# Patient Record
Sex: Male | Born: 1950 | Race: White | Hispanic: No | Marital: Married | Smoking: Never smoker
Health system: Southern US, Community
[De-identification: ages and names within clinical notes are randomized; demographics above are authoritative.]

## PROBLEM LIST (undated history)

## (undated) DIAGNOSIS — I2119 ST elevation (STEMI) myocardial infarction involving other coronary artery of inferior wall: Secondary | ICD-10-CM

## (undated) DIAGNOSIS — I10 Essential (primary) hypertension: Secondary | ICD-10-CM

## (undated) HISTORY — DX: ST elevation (STEMI) myocardial infarction involving other coronary artery of inferior wall: I21.19

## (undated) HISTORY — PX: KNEE SURGERY: SHX244

## (undated) HISTORY — PX: HERNIA REPAIR: SHX51

## (undated) HISTORY — PX: NECK SURGERY: SHX720

---

## 2012-02-06 ENCOUNTER — Other Ambulatory Visit: Payer: Self-pay

## 2012-02-06 ENCOUNTER — Encounter (HOSPITAL_COMMUNITY): Payer: Self-pay

## 2012-02-06 ENCOUNTER — Inpatient Hospital Stay (HOSPITAL_BASED_OUTPATIENT_CLINIC_OR_DEPARTMENT_OTHER)
Admission: EM | Admit: 2012-02-06 | Discharge: 2012-02-08 | DRG: 853 | Disposition: A | Discharge status: Home / Self Care | Payer: BC Managed Care – PPO | Attending: Cardiovascular Disease | Admitting: Cardiovascular Disease

## 2012-02-06 ENCOUNTER — Emergency Department (HOSPITAL_BASED_OUTPATIENT_CLINIC_OR_DEPARTMENT_OTHER): Payer: BC Managed Care – PPO

## 2012-02-06 ENCOUNTER — Encounter (HOSPITAL_BASED_OUTPATIENT_CLINIC_OR_DEPARTMENT_OTHER): Payer: Self-pay | Admitting: *Deleted

## 2012-02-06 DIAGNOSIS — I213 ST elevation (STEMI) myocardial infarction of unspecified site: Secondary | ICD-10-CM | POA: Diagnosis present

## 2012-02-06 DIAGNOSIS — I2119 ST elevation (STEMI) myocardial infarction involving other coronary artery of inferior wall: Principal | ICD-10-CM | POA: Diagnosis present

## 2012-02-06 DIAGNOSIS — E669 Obesity, unspecified: Secondary | ICD-10-CM

## 2012-02-06 DIAGNOSIS — E785 Hyperlipidemia, unspecified: Secondary | ICD-10-CM | POA: Diagnosis present

## 2012-02-06 DIAGNOSIS — Z6841 Body Mass Index (BMI) 40.0 and over, adult: Secondary | ICD-10-CM

## 2012-02-06 DIAGNOSIS — E119 Type 2 diabetes mellitus without complications: Secondary | ICD-10-CM | POA: Diagnosis present

## 2012-02-06 DIAGNOSIS — I10 Essential (primary) hypertension: Secondary | ICD-10-CM | POA: Diagnosis present

## 2012-02-06 DIAGNOSIS — I2510 Atherosclerotic heart disease of native coronary artery without angina pectoris: Secondary | ICD-10-CM

## 2012-02-06 DIAGNOSIS — I2582 Chronic total occlusion of coronary artery: Secondary | ICD-10-CM | POA: Diagnosis present

## 2012-02-06 DIAGNOSIS — E1169 Type 2 diabetes mellitus with other specified complication: Secondary | ICD-10-CM | POA: Diagnosis present

## 2012-02-06 DIAGNOSIS — Z79899 Other long term (current) drug therapy: Secondary | ICD-10-CM

## 2012-02-06 HISTORY — PX: CARDIAC CATHETERIZATION: SHX172

## 2012-02-06 HISTORY — DX: Essential (primary) hypertension: I10

## 2012-02-06 LAB — COMPREHENSIVE METABOLIC PANEL
ALT: 16 U/L (ref 0–53)
Alkaline Phosphatase: 56 U/L (ref 39–117)
CO2: 29 mEq/L (ref 19–32)
Calcium: 11.1 mg/dL — ABNORMAL HIGH (ref 8.4–10.5)
GFR calc Af Amer: 82 mL/min — ABNORMAL LOW (ref >90)
GFR calc non Af Amer: 71 mL/min — ABNORMAL LOW (ref >90)
Glucose, Bld: 179 mg/dL — ABNORMAL HIGH (ref 70–99)
Sodium: 137 mEq/L (ref 135–145)

## 2012-02-06 LAB — CBC
HCT: 45.8 % (ref 39.0–52.0)
Hemoglobin: 15.4 g/dL (ref 13.0–17.0)
MCH: 30.6 pg (ref 26.0–34.0)
RBC: 5.03 MIL/uL (ref 4.22–5.81)

## 2012-02-06 SURGERY — LEFT HEART CATHETERIZATION WITH CORONARY ANGIOGRAM

## 2012-02-06 MED ORDER — NITROGLYCERIN 1 MG/10 ML FOR IR/CATH LAB
INTRA_ARTERIAL | Status: AC
  Filled 2012-02-06: qty 10

## 2012-02-06 MED ORDER — MIDAZOLAM HCL 2 MG/2ML IJ SOLN
INTRAMUSCULAR | Status: AC
  Filled 2012-02-06: qty 2

## 2012-02-06 MED ORDER — HEPARIN (PORCINE) IN NACL 100-0.45 UNIT/ML-% IJ SOLN
INTRAMUSCULAR | Status: AC
  Administered 2012-02-06: 23:00:00
  Filled 2012-02-06: qty 250

## 2012-02-06 MED ORDER — ASPIRIN 81 MG PO CHEW
CHEWABLE_TABLET | ORAL | Status: AC
  Administered 2012-02-06: 324 mg via ORAL
  Filled 2012-02-06: qty 4

## 2012-02-06 MED ORDER — HEPARIN (PORCINE) IN NACL 2-0.9 UNIT/ML-% IJ SOLN
INTRAMUSCULAR | Status: AC
  Filled 2012-02-06: qty 1000

## 2012-02-06 MED ORDER — ASPIRIN 81 MG PO CHEW
324.0000 mg | CHEWABLE_TABLET | Freq: Once | ORAL | Status: AC
  Administered 2012-02-06: 324 mg via ORAL

## 2012-02-06 MED ORDER — HEPARIN SODIUM (PORCINE) 5000 UNIT/ML IJ SOLN
60.0000 [IU]/kg | INTRAMUSCULAR | Status: DC
  Administered 2012-02-06: 23:00:00 via INTRAVENOUS

## 2012-02-06 MED ORDER — NITROGLYCERIN 1 MG/10 ML FOR IR/CATH LAB
INTRA_ARTERIAL | Status: AC
Start: 2012-02-06 — End: 2012-02-06
  Filled 2012-02-06: qty 10

## 2012-02-06 MED ORDER — LIDOCAINE HCL (PF) 1 % IJ SOLN
INTRAMUSCULAR | Status: AC
  Filled 2012-02-06: qty 30

## 2012-02-06 MED ORDER — SODIUM CHLORIDE 0.9 % IV SOLN
INTRAVENOUS | Status: DC
  Administered 2012-02-06: 23:00:00 via INTRAVENOUS

## 2012-02-06 MED ORDER — FENTANYL CITRATE 0.05 MG/ML IJ SOLN
INTRAMUSCULAR | Status: AC
  Filled 2012-02-06: qty 2

## 2012-02-06 NOTE — H&P (Signed)
Patient ID: Deangelo Berns MRN: [id number], DOB/AGE: 04-07-1950   Admit date: 02/06/2012   Primary Physician: No primary provider on file. Primary Cardiologist: Dr Tresa Endo (new)  HPI:  62 y/o male with no prior history of CAD, recently moved here from [facility]. He developed chest pain off and on since Friday 02/03/12. He presented to [facility] tonight and an EKG showed inf ST elevation with ST depression in lead 1 and AVL. STEMI protocol was activated and the pt was transported to [facility]. He is taken to the cath lab urgenlty by Dr Tresa Endo. On arrival to the cath his VS were stable and he was in no distress.   Problem List: Past Medical History  Diagnosis Date  . Hypertension     Past Surgical History  Procedure Date  . Neck surgery   . Knee surgery   . Hernia repair     Rt groin     Allergies: No Known Allergies   Home Medications Prescriptions prior to admission  Medication Sig Dispense Refill  . olmesartan (BENICAR) 20 MG tablet Take 20 mg by mouth daily.         No family history on file.   History   Social History  . Marital Status: Married    Spouse Name: N/A    Number of Children: N/A  . Years of Education: N/A   Occupational History  . Not on file.   Social History Main Topics  . Smoking status: Never Smoker   . Smokeless tobacco: Not on file  . Alcohol Use: Yes  . Drug Use: No  . Sexually Active:    Other Topics Concern  . Not on file   Social History Narrative  . No narrative on file     ROS- not obtained as pt was taken to the lab directly from ER  Physical Exam: Blood pressure 141/105, pulse 67, temperature 98 F (36.7 C), temperature source Oral, resp. rate 20, height 6\' 1"  (1.854 m), weight 145.151 kg (320 lb), SpO2 98.00%.  General appearance: alert, cooperative, no distress and morbidly obese Neck: no carotid bruit and no JVD Lungs: clear to auscultation bilaterally Heart: regular rate and rhythm, S1, S2 normal, no murmur,  click, rub or gallop Abdomen: obese, Rt inguinal hernia scar Extremities: extremities normal, atraumatic, no cyanosis or edema Pulses: 2+ and symmetric Skin: Skin color, texture, turgor normal. No rashes or lesions Neurologic: Grossly normal    Labs:   Results for orders placed during the hospital encounter of 02/06/12 (from the past 24 hour(s))  APTT     Status: Abnormal   Collection Time   02/06/12 11:05 PM      Component Value Range   aPTT 156 (*) 24 - 37 seconds  CBC     Status: Abnormal   Collection Time   02/06/12 11:05 PM      Component Value Range   WBC 14.0 (*) 4.0 - 10.5 K/uL   RBC 5.03  4.22 - 5.81 MIL/uL   Hemoglobin 15.4  13.0 - 17.0 g/dL   HCT 40.9  81.1 - 91.4 %   MCV 91.1  78.0 - 100.0 fL   MCH 30.6  26.0 - 34.0 pg   MCHC 33.6  30.0 - 36.0 g/dL   RDW 78.2  95.6 - 21.3 %   Platelets 248  150 - 400 K/uL  COMPREHENSIVE METABOLIC PANEL     Status: Abnormal   Collection Time   02/06/12 11:05 PM  Component Value Range   Sodium 137  135 - 145 mEq/L   Potassium 3.9  3.5 - 5.1 mEq/L   Chloride 97  96 - 112 mEq/L   CO2 29  19 - 32 mEq/L   Glucose, Bld 179 (*) 70 - 99 mg/dL   BUN 21  6 - 23 mg/dL   Creatinine, Ser 1.61  0.50 - 1.35 mg/dL   Calcium 09.6 (*) 8.4 - 10.5 mg/dL   Total Protein 7.5  6.0 - 8.3 g/dL   Albumin 3.9  3.5 - 5.2 g/dL   AST 16  0 - 37 U/L   ALT 16  0 - 53 U/L   Alkaline Phosphatase 56  39 - 117 U/L   Total Bilirubin 0.3  0.3 - 1.2 mg/dL   GFR calc non Af Amer 71 (*) >90 mL/min   GFR calc Af Amer 82 (*) >90 mL/min  PROTIME-INR     Status: Normal   Collection Time   02/06/12 11:05 PM      Component Value Range   Prothrombin Time 13.7  11.6 - 15.2 seconds   INR 1.06  0.00 - 1.49     Radiology/Studies: Dg Chest Port 1 View  02/06/2012  *RADIOLOGY REPORT*  Clinical Data: Chest pain.  Myocardial infarct.  PORTABLE CHEST - 1 VIEW  Comparison: None.  Findings: Shallow inspiration.  Borderline heart size with normal pulmonary vascularity.  No  focal consolidation or airspace disease. No blunting of costophrenic angles.  No pneumothorax.  Mediastinal contours appear intact.  Postoperative changes in the cervical spine.  Degenerative changes in the left shoulder.  IMPRESSION: Shallow inspiration.  Mild cardiac enlargement.  No active pulmonary disease.   Original Report Authenticated By: Burman Nieves, M.D.     EKG:NSR. Inf ST elevation, ST depression lead 1 and AVL  ASSESSMENT AND PLAN:  Principal Problem:  *STEMI (ST elevation myocardial infarction) Active Problems:  HTN (hypertension)  Obesity  Plan- urgent cath  SignedAbelino Derrick, PA-C 02/07/2012, 12:00 AM    Patient seen and examined. Agree with assessment and plan. 62 yo WM with a history of htn on meds for 3 years, [facility] in setting of inferior STEMI with 2 mm inferior ST elevation and ST depression in I and avL. Pt has had 3 days of intermittent chest pain, and cp since 10 am. Plan emergent cath. Suspect RCA occlusion.   Lennette Bihari, MD, [facility] 02/07/2012 9:40 AM

## 2012-02-06 NOTE — ED Provider Notes (Signed)
History     CSN: [id number]  Arrival date & time 02/06/12  2241   First MD Initiated Contact with Patient 02/06/12 2257      Chief Complaint  Patient presents with  . Chest Pain    (Consider location/radiation/quality/duration/timing/severity/associated sxs/prior treatment) HPI This is a [age]-year-old male with a history of hypertension. He has no cardiac history. He has been having chest pain off and on for the last 3 days. There's been no all exacerbation with activity or relief with rest, though he has had worsening of symptoms after eating. He has taken TUMS without relief. The pain is located in the subxiphoid region without radiation. He describes it as a sharp, deep pain, about 7/10. There is no associated shortness of breath, nausea or diaphoresis.  Past Medical History  Diagnosis Date  . Hypertension     Past Surgical History  Procedure Date  . Neck surgery   . Knee surgery     No family history on file.  History  Substance Use Topics  . Smoking status: Never Smoker   . Smokeless tobacco: Not on file  . Alcohol Use: Yes      Review of Systems  All other systems reviewed and are negative.    Allergies  Review of patient's allergies indicates no known allergies.  Home Medications   Current Outpatient Rx  Name  Route  Sig  Dispense  Refill  . OLMESARTAN MEDOXOMIL 20 MG PO TABS   Oral   Take 20 mg by mouth daily.           BP 141/105  Pulse 67  Temp 98 F (36.7 C) (Oral)  Resp 20  SpO2 98%  Physical Exam General: Well-developed, well-nourished male in no acute distress; appearance consistent with age of record HENT: normocephalic, atraumatic Eyes: pupils equal round and reactive to light; extraocular muscles intact Neck: supple Heart: regular rate and rhythm Lungs: clear to auscultation bilaterally Abdomen: soft; nondistended; nontender; bowel sounds present Extremities: No deformity; full range of motion; pulses normal Neurologic: Awake,  alert and oriented; motor function intact in all extremities and symmetric; no facial droop Skin: Warm and dry Psychiatric: Normal mood and affect    ED Course  Procedures (including critical care time)  CRITICAL CARE Performed by: Malaisha Silliman L   Total critical care time: 30 minutes  Critical care time was exclusive of separately billable procedures and treating other patients.  Critical care was necessary to treat or prevent imminent or life-threatening deterioration.  Critical care was time spent personally by me on the following activities: development of treatment plan with patient and/or surrogate as well as nursing, discussions with consultants, evaluation of patient's response to treatment, examination of patient, obtaining history from patient or surrogate, ordering and performing treatments and interventions, ordering and review of laboratory studies, ordering and review of radiographic studies, pulse oximetry and re-evaluation of patient's condition.   MDM   Nursing notes and vitals signs, including pulse oximetry, reviewed.  Summary of this visit's results, reviewed by myself:  Labs:  Results for orders placed during the hospital encounter of 02/06/12 (from the past 24 hour(s))  APTT     Status: Abnormal   Collection Time   02/06/12 11:05 PM      Component Value Range   aPTT 156 (*) 24 - 37 seconds  CBC     Status: Abnormal   Collection Time   02/06/12 11:05 PM      Component Value Range   WBC 14.0 (*)  4.0 - 10.5 K/uL   RBC 5.03  4.22 - 5.81 MIL/uL   Hemoglobin 15.4  13.0 - 17.0 g/dL   HCT 65.7  84.6 - 96.2 %   MCV 91.1  78.0 - 100.0 fL   MCH 30.6  26.0 - 34.0 pg   MCHC 33.6  30.0 - 36.0 g/dL   RDW 95.2  84.1 - 32.4 %   Platelets 248  150 - 400 K/uL  COMPREHENSIVE METABOLIC PANEL     Status: Abnormal   Collection Time   02/06/12 11:05 PM      Component Value Range   Sodium 137  135 - 145 mEq/L   Potassium 3.9  3.5 - 5.1 mEq/L   Chloride 97  96 - 112 mEq/L    CO2 29  19 - 32 mEq/L   Glucose, Bld 179 (*) 70 - 99 mg/dL   BUN 21  6 - 23 mg/dL   Creatinine, Ser 4.01  0.50 - 1.35 mg/dL   Calcium 02.7 (*) 8.4 - 10.5 mg/dL   Total Protein 7.5  6.0 - 8.3 g/dL   Albumin 3.9  3.5 - 5.2 g/dL   AST 16  0 - 37 U/L   ALT 16  0 - 53 U/L   Alkaline Phosphatase 56  39 - 117 U/L   Total Bilirubin 0.3  0.3 - 1.2 mg/dL   GFR calc non Af Amer 71 (*) >90 mL/min   GFR calc Af Amer 82 (*) >90 mL/min  PROTIME-INR     Status: Normal   Collection Time   02/06/12 11:05 PM      Component Value Range   Prothrombin Time 13.7  11.6 - 15.2 seconds   INR 1.06  0.00 - 1.49    Imaging Studies: Dg Chest Port 1 View  02/06/2012  *RADIOLOGY REPORT*  Clinical Data: Chest pain.  Myocardial infarct.  PORTABLE CHEST - 1 VIEW  Comparison: None.  Findings: Shallow inspiration.  Borderline heart size with normal pulmonary vascularity.  No focal consolidation or airspace disease. No blunting of costophrenic angles.  No pneumothorax.  Mediastinal contours appear intact.  Postoperative changes in the cervical spine.  Degenerative changes in the left shoulder.  IMPRESSION: Shallow inspiration.  Mild cardiac enlargement.  No active pulmonary disease.   Original Report Authenticated By: Burman Nieves, M.D.       EKG Interpretation:  Date & Time: 02/06/2012 10:56 PM  Rate: 71  Rhythm: normal sinus rhythm  QRS Axis: normal  Intervals: normal  ST/T Wave abnormalities: Acute inferior myocardial infarction  Conduction Disutrbances:none  Narrative Interpretation:   Old EKG Reviewed: none available  11:25 PM Patient given chewable aspirin 324 mg. He was given 4000 unit heparin bolus and a heparin drip at 1000 units per hour. A Code STEMI was called immediately on evaluation of his EKG. The cath lab team was called in and Dr. Nicholaus Bloom is awaiting the patient [facility]. He was transported by EMS due to delay in getting a [facility] vehicle.   Hanley Seamen, MD 02/07/12 [phone]

## 2012-02-06 NOTE — ED Notes (Signed)
Code STEMI called by Dr. Read Drivers at 2300.  Work was in process to comply with Code STEMI

## 2012-02-06 NOTE — ED Notes (Addendum)
Pt. Has monitor on with Asprin 324mg  given and Heparin bolus of 4000units and 1000units per hour.  Pt. Is in no resp. Distress and has oxygen at 2liters [place].

## 2012-02-06 NOTE — ED Notes (Signed)
Pt. Gave verbal consent and will go to [facility] via EMS .

## 2012-02-06 NOTE — ED Notes (Signed)
Corey Hardin wife [phone] is her number to call.

## 2012-02-06 NOTE — ED Notes (Signed)
Sternal pain on and off x 3 days. No relief with tums. Pale. Unable to sleep.

## 2012-02-07 ENCOUNTER — Ambulatory Visit (HOSPITAL_COMMUNITY): Admit: 2012-02-07 | Payer: Self-pay | Admitting: Cardiovascular Disease

## 2012-02-07 ENCOUNTER — Encounter (HOSPITAL_COMMUNITY)
Admission: EM | Disposition: A | Discharge status: Home / Self Care | Payer: Self-pay | Source: Home / Self Care | Attending: Cardiovascular Disease

## 2012-02-07 DIAGNOSIS — E785 Hyperlipidemia, unspecified: Secondary | ICD-10-CM | POA: Diagnosis present

## 2012-02-07 DIAGNOSIS — E669 Obesity, unspecified: Secondary | ICD-10-CM | POA: Diagnosis present

## 2012-02-07 DIAGNOSIS — I2510 Atherosclerotic heart disease of native coronary artery without angina pectoris: Secondary | ICD-10-CM | POA: Diagnosis present

## 2012-02-07 HISTORY — PX: LEFT HEART CATHETERIZATION WITH CORONARY ANGIOGRAM: SHX5451

## 2012-02-07 LAB — URINALYSIS, ROUTINE W REFLEX MICROSCOPIC
Bilirubin Urine: NEGATIVE
Glucose, UA: NEGATIVE mg/dL
Hgb urine dipstick: NEGATIVE
Ketones, ur: NEGATIVE mg/dL
Nitrite: NEGATIVE
Specific Gravity, Urine: <1.005 — ABNORMAL LOW (ref 1.005–1.030)
pH: 6 (ref 5.0–8.0)

## 2012-02-07 LAB — POCT ACTIVATED CLOTTING TIME: Activated Clotting Time: 889 seconds

## 2012-02-07 LAB — BASIC METABOLIC PANEL
BUN: 19 mg/dL (ref 6–23)
CO2: 26 mEq/L (ref 19–32)
Calcium: 10 mg/dL (ref 8.4–10.5)
Calcium: 10.1 mg/dL (ref 8.4–10.5)
Chloride: 100 mEq/L (ref 96–112)
Creatinine, Ser: 0.91 mg/dL (ref 0.50–1.35)
GFR calc Af Amer: >90 mL/min (ref >90)
GFR calc non Af Amer: >90 mL/min (ref >90)
Glucose, Bld: 159 mg/dL — ABNORMAL HIGH (ref 70–99)
Glucose, Bld: 128 mg/dL — ABNORMAL HIGH (ref 70–99)
Potassium: 4.5 mEq/L (ref 3.5–5.1)
Sodium: 136 mEq/L (ref 135–145)

## 2012-02-07 LAB — TSH: TSH: 1.662 u[IU]/mL (ref 0.350–4.500)

## 2012-02-07 LAB — LIPID PANEL
Cholesterol: 202 mg/dL — ABNORMAL HIGH (ref 0–200)
HDL: 45 mg/dL (ref >39)
LDL Cholesterol: 136 mg/dL — ABNORMAL HIGH (ref 0–99)
Total CHOL/HDL Ratio: 4.5 RATIO
Triglycerides: 106 mg/dL (ref <150)
VLDL: 21 mg/dL (ref 0–40)

## 2012-02-07 LAB — CBC
HCT: 43.8 % (ref 39.0–52.0)
Hemoglobin: 14.7 g/dL (ref 13.0–17.0)
MCH: 29.9 pg (ref 26.0–34.0)
MCH: 29.8 pg (ref 26.0–34.0)
MCHC: 32.7 g/dL (ref 30.0–36.0)
MCV: 91.1 fL (ref 78.0–100.0)
Platelets: 219 10*3/uL (ref 150–400)
RBC: 4.81 MIL/uL (ref 4.22–5.81)
RDW: 13.2 % (ref 11.5–15.5)
WBC: 12.9 10*3/uL — ABNORMAL HIGH (ref 4.0–10.5)

## 2012-02-07 LAB — TROPONIN I
Troponin I: 14.57 ng/mL (ref <0.30)
Troponin I: 7.76 ng/mL (ref <0.30)
Troponin I: 6.15 ng/mL (ref <0.30)

## 2012-02-07 SURGERY — LEFT HEART CATHETERIZATION WITH CORONARY ANGIOGRAM
Anesthesia: LOCAL

## 2012-02-07 MED ORDER — TRAMADOL HCL 50 MG PO TABS
50.0000 mg | ORAL_TABLET | Freq: Four times a day (QID) | ORAL | Status: DC | PRN
  Filled 2012-02-07: qty 1

## 2012-02-07 MED ORDER — ASPIRIN EC 81 MG PO TBEC
81.0000 mg | DELAYED_RELEASE_TABLET | Freq: Every day | ORAL | Status: DC
  Administered 2012-02-07 – 2012-02-08 (×2): 81 mg via ORAL
  Filled 2012-02-07 (×2): qty 1

## 2012-02-07 MED ORDER — SODIUM CHLORIDE 0.9 % IJ SOLN: 3.0000 mL | INTRAMUSCULAR | Status: DC | PRN

## 2012-02-07 MED ORDER — ONDANSETRON HCL 4 MG/2ML IJ SOLN: 4.0000 mg | Freq: Four times a day (QID) | INTRAMUSCULAR | Status: DC | PRN

## 2012-02-07 MED ORDER — ALPRAZOLAM 0.25 MG PO TABS: 0.2500 mg | ORAL_TABLET | Freq: Two times a day (BID) | ORAL | Status: DC | PRN

## 2012-02-07 MED ORDER — BIVALIRUDIN 250 MG IV SOLR
INTRAVENOUS | Status: AC
  Filled 2012-02-07: qty 250

## 2012-02-07 MED ORDER — MORPHINE SULFATE 2 MG/ML IJ SOLN: 2.0000 mg | INTRAMUSCULAR | Status: DC | PRN

## 2012-02-07 MED ORDER — SODIUM CHLORIDE 0.9 % IV SOLN: 0.2500 mg/kg/h | INTRAVENOUS | Status: DC

## 2012-02-07 MED ORDER — SODIUM CHLORIDE 0.9 % IJ SOLN
3.0000 mL | Freq: Two times a day (BID) | INTRAMUSCULAR | Status: DC
  Administered 2012-02-07 – 2012-02-08 (×4): 3 mL via INTRAVENOUS

## 2012-02-07 MED ORDER — PANTOPRAZOLE SODIUM 40 MG PO TBEC
40.0000 mg | DELAYED_RELEASE_TABLET | Freq: Every day | ORAL | Status: DC
  Administered 2012-02-08: 40 mg via ORAL
  Filled 2012-02-07: qty 1

## 2012-02-07 MED ORDER — ACETAMINOPHEN 325 MG PO TABS: 650.0000 mg | ORAL_TABLET | ORAL | Status: DC | PRN

## 2012-02-07 MED ORDER — SODIUM CHLORIDE 0.9 % IV SOLN: INTRAVENOUS | Status: DC

## 2012-02-07 MED ORDER — SODIUM CHLORIDE 0.9 % IV SOLN: 1.0000 mg/kg/h | INTRAVENOUS | Status: DC

## 2012-02-07 MED ORDER — TICAGRELOR 90 MG PO TABS
90.0000 mg | ORAL_TABLET | Freq: Two times a day (BID) | ORAL | Status: DC
  Administered 2012-02-07 – 2012-02-08 (×3): 90 mg via ORAL
  Filled 2012-02-07 (×4): qty 1

## 2012-02-07 MED ORDER — IRBESARTAN 75 MG PO TABS
75.0000 mg | ORAL_TABLET | Freq: Every day | ORAL | Status: DC
  Administered 2012-02-07 – 2012-02-08 (×2): 75 mg via ORAL
  Filled 2012-02-07 (×2): qty 1

## 2012-02-07 MED ORDER — ZOLPIDEM TARTRATE 5 MG PO TABS: 5.0000 mg | ORAL_TABLET | Freq: Every evening | ORAL | Status: DC | PRN

## 2012-02-07 MED ORDER — NITROGLYCERIN 0.4 MG SL SUBL: 0.4000 mg | SUBLINGUAL_TABLET | SUBLINGUAL | Status: DC | PRN

## 2012-02-07 MED ORDER — METOPROLOL TARTRATE 12.5 MG HALF TABLET
12.5000 mg | ORAL_TABLET | Freq: Two times a day (BID) | ORAL | Status: DC
  Administered 2012-02-07 – 2012-02-08 (×3): 12.5 mg via ORAL
  Filled 2012-02-07 (×6): qty 1

## 2012-02-07 MED ORDER — ASPIRIN EC 81 MG PO TBEC: 81.0000 mg | DELAYED_RELEASE_TABLET | Freq: Every day | ORAL | Status: DC

## 2012-02-07 MED ORDER — SIMVASTATIN 40 MG PO TABS
40.0000 mg | ORAL_TABLET | Freq: Every day | ORAL | Status: DC
  Administered 2012-02-07: 40 mg via ORAL
  Filled 2012-02-07 (×2): qty 1

## 2012-02-07 MED ORDER — SODIUM CHLORIDE 0.9 % IV SOLN: 250.0000 mL | INTRAVENOUS | Status: DC | PRN

## 2012-02-07 MED ORDER — TICAGRELOR 90 MG PO TABS
ORAL_TABLET | ORAL | Status: AC
  Filled 2012-02-07: qty 2

## 2012-02-07 NOTE — CV Procedure (Signed)
Emergency Cath/PCI to RCA  Corey Hardin, 62 y.o., male  Full note dictated; see diagram  DICTATION # [phone], [id number]  AO:120/70 LV:120/19  LM: nl LAD: mild luminal irregularities proximally, 40% ostial DX1 LCx: nl RCA: prox-mid 100% occlusion  Successful PCI to RCA with Asahi medium wire, 2.0 x 12 trek, 2.5 x 23 Xience Xpedition DES stent, 2.75 x 15 [place] Trek with 100% to 0% and restoration of TIMI 3 flow.  EF 55% with minimal inferior hypokinesis  DTB time: 20 minutes  Angiomax, 180 mg brilinta, IC NTG.  Lennette Bihari, MD, [facility] 02/07/2012 1:10 AM

## 2012-02-07 NOTE — Progress Notes (Signed)
UR Completed.  Corey Hardin Jane [phone] 02/07/2012  

## 2012-02-07 NOTE — Cardiovascular Report (Signed)
NAMECHRISTIAN, Corey Hardin NO.:  [id number]  MEDICAL RECORD NO.:  [id number]  LOCATION:  2908                         FACILITY:  [facility]  PHYSICIAN:  Nicki Guadalajara, M.D.     DATE OF BIRTH:  [birth year]  DATE OF PROCEDURE:  02/06/2012 DATE OF DISCHARGE:                           CARDIAC CATHETERIZATION   PROCEDURE:  Emergent cardiac catheterization/percutaneous coronary intervention to a totally occluded right coronary artery.  INDICATIONS:  Mr. Corey Hardin is a 62 year old gentleman who has recently moved to this area from [place].  The patient has a several year history of hypertension as well as a history of obesity.  Approximately 3 days ago, he began noticing epigastric substernal chest pain which he attributed to indigestion.  At approximately 10:00 am today, he had recurrent episodes of chest pain, essentially all day and [facility] approximately just before 11 p.m. this evening.  The ECG revealed 2 to 3 mm of inferior ST elevation in II, III and F with reciprocal ST-segment depression and T-wave inversion in I and L.  An inferior code STEMI was called.  The patient was transported to [facility].  Prior to transfer, he was given aspirin, 4000 units of heparin, and morphine.  Upon arrival to [facility], he was taken up to the catheterization laboratory for acute catheterization.  DESCRIPTION OF PROCEDURE:  Upon arrival to the catheterization laboratory, the patient's chest pain had improved but he still had residual 4 to 10 chest discomfort, improved from 8/10.  His right femoral artery was punctured anteriorly and a 6-French sheath was inserted without difficulty.  Acute catheterization was done utilizing 6- [place] Jud[place] for a left diagnostic catheter.  With the supposition of an occluded RCA, a right guiding catheter was then inserted, 6-French. The patient received Angiomax bolus plus infusion and also 180 mg of oral  Brilinta.  His RCA was found to be totally occluded with TIMI 0 flow to the proximal to mid segment.  Asahi medium wire was advanced into the RCA and was able to open up the total occlusion.  Initial dilatation was done with a 2.0 x 12 mm TREK balloon.  A 2.5 x 23 mm Xience DES Xpedition stent was then inserted and deployed x2 to 11 atmospheres.  Post stent dilatation was done with a 2.75 x 15 mm noncompliant TREK.  Scout angiography confirmed an excellent angiographic result with brisk TIMI-3 flow and no evidence for dissection.  The balloon wire and right guide were removed and a 6-French pigtail catheter was inserted and advanced into the left ventricle.  RAO ventriculography was performed.  An LV-AO pullback was performed.  The arterial sheath was sutured in place.  The plan will be for the patient to continue Angiomax for approximately 4 hours from his Brilinta dosing. He left the catheterization laboratory, was transported to the CCU with stable hemodynamics pain free.  HEMODYNAMIC DATA:  Central aortic pressure is 120/70.  Left ventricular pressure 120/19.  ANGIOGRAPHIC DATA:  Left main coronary artery was angiographically normal and bifurcated into an LAD and left circumflex system.  The proximal LAD had mild luminal irregularities before giving rise to  the 1st proximal diagonal vessel.  There was 40% ostial narrowing at the origin of this diagonal vessel.  The remainder of the LAD was free of significant disease.  The circumflex vessel gave rise to 2 marginal vessels and was free of significant disease.  The right coronary artery was totally occluded in the proximal to mid segment and there was TIMI 0 flow.  Following successful coronary intervention to the RCA with PTCA and ultimate stenting with a 2.5 x 23 mm DES Xience stent, post dilated to approximately 2.71 mm, the 100% occlusion was reduced to 0%.  There was brisk TIMI-3 flow, no evidence for dissection.  The  patient arrived to [facility] catheterization laboratory at 2339.  The 1st balloon inflation was at 2359 giving a total balloon time of 20 minutes.  IMPRESSION: 1. Acute ST-segment elevation inferior wall myocardial infarction     secondary to total occlusion of the proximal to mid right coronary     artery. 2. Mild luminal irregularities of the LAD with 40% ostial diagonal 1     stenosis. 3. Normal circumflex of coronary artery. 4. Successful percutaneous coronary intervention of the of a totally     occluded right coronary artery with 100% occlusion being reduced to     0% with ultimate insertion of a 2.5 x 23 mm Xience DES Xpedition     stent post dilated to 2.71 mm. 5. Bivalirudin/one 180 mg of Brilinta/IC nitroglycerin. 6. Total balloon time 20 minutes.          ______________________________ Nicki Guadalajara, M.D.     TK/MEDQ  D:  02/07/2012  T:  02/07/2012  Job:  [id number]

## 2012-02-07 NOTE — Progress Notes (Signed)
[facility]  Subjective: No further CP.  No SOB, orthopnea  Objective: Vital signs in last 24 hours: Temp:  [97.6 F (36.4 C)-98 F (36.7 C)] 97.6 F (36.4 C) (02/04 0100) Pulse Rate:  [62-89] 62  (02/04 0600) Resp:  [9-20] 12  (02/04 0600) BP: (111-141)/(64-105) 113/67 mmHg (02/04 0600) SpO2:  [94 %-98 %] 97 % (02/04 0600) Arterial Line BP: (118-141)/(68-78) 131/74 mmHg (02/04 0600) Weight:  [145.151 kg (320 lb)] 145.151 kg (320 lb) (02/03 2307)    Intake/Output from previous day: 02/03 0701 - 02/04 0700 In: 560.8 [I.V.:560.8] Out: 475 [Urine:475] Intake/Output this shift:    Medications Current Facility-Administered Medications  Medication Dose Route Frequency Provider Last Rate Last Dose  . 0.9 %  sodium chloride infusion   Intravenous Continuous Carlisle Beers Mol[place], MD 20 mL/hr at 02/06/12 2315    . 0.9 %  sodium chloride infusion  250 mL Intravenous PRN Abelino Derrick, PA      . 0.9 %  sodium chloride infusion   Intravenous Continuous Lennette Bihari, MD 125 mL/hr at 02/07/12 0140 125 mL/hr at 02/07/12 0140  . acetaminophen (TYLENOL) tablet 650 mg  650 mg Oral Q4H PRN Lennette Bihari, MD      . ALPRAZolam Prudy Feeler) tablet 0.25 mg  0.25 mg Oral BID PRN Abelino Derrick, PA      . aspirin EC tablet 81 mg  81 mg Oral Daily Lennette Bihari, MD      . bivalirudin (ANGIOMAX) 5 mg/mL in sodium chloride 0.9 % 50 mL infusion  0.25 mg/kg/hr Intravenous Continuous Lennette Bihari, MD   0.25 mg/kg/hr at 02/07/12 0210  . irbesartan (AVAPRO) tablet 75 mg  75 mg Oral Daily Eda Paschal [place], [place]      . metoprolol tartrate (LOPRESSOR) tablet 12.5 mg  12.5 mg Oral BID Eda Paschal [place], PA   12.5 mg at 02/07/12 0215  . morphine 2 MG/ML injection 2 mg  2 mg Intravenous Q1H PRN Lennette Bihari, MD      . nitroGLYCERIN (NITROSTAT) SL tablet 0.4 mg  0.4 mg Sublingual Q5 Min x 3 PRN Abelino Derrick, PA      . ondansetron [facility]) injection 4 mg  4 mg Intravenous Q6H PRN Lennette Bihari, MD       . pantoprazole (PROTONIX) EC tablet 40 mg  40 mg Oral Q0600 Eda Paschal [place], [place]      . simvastatin (ZOCOR) tablet 40 mg  40 mg Oral q1800 Eda Paschal [place], [place]      . sodium chloride 0.9 % injection 3 mL  3 mL Intravenous Q12H Eda Paschal [place], PA   3 mL at 02/07/12 0118  . sodium chloride 0.9 % injection 3 mL  3 mL Intravenous PRN Abelino Derrick, PA      . Ticagrelor (BRILINTA) tablet 90 mg  90 mg Oral BID Lennette Bihari, MD      . traMADol Janean Sark) tablet 50 mg  50 mg Oral Q6H PRN Abelino Derrick, PA      . zolpidem (AMBIEN) tablet 5 mg  5 mg Oral QHS PRN,MR X 1 Eda Paschal [place], [place]        PE: General appearance: alert, cooperative and no distress Lungs: clear to auscultation bilaterally Heart: regular rate and rhythm, S1, S2 normal, no murmur, click, rub or gallop Extremities: 1+ LEE Pulses: 2+ and symmetric Skin: Warm and dry Neurologic: Grossly normal  Lab Results:  Basename 02/07/12 0510 02/07/12 0125 02/06/12 2305  WBC 10.7* 12.9* 14.0*  HGB 14.7 14.4 15.4  HCT 45.0 43.8 45.8  PLT 220 219 248   BMET  Basename 02/07/12 0510 02/07/12 0125 02/06/12 2305  NA 136 136 137  K 4.5 4.3 3.9  CL 100 100 97  CO2 27 26 29   GLUCOSE 128* 159* 179*  BUN 17 19 21   CREATININE 0.87 0.91 1.10  CALCIUM 10.1 10.0 11.1*   PT/INR  Basename 02/06/12 2305  LABPROT 13.7  INR 1.06   Cholesterol  Basename 02/07/12 0125  CHOL 202*   Lipid Panel     Component Value Date/Time   CHOL 202* 02/07/2012 0125   TRIG 106 02/07/2012 0125   HDL 45 02/07/2012 0125   CHOLHDL 4.5 02/07/2012 0125   VLDL 21 02/07/2012 0125   LDLCALC 136* 02/07/2012 0125    Cardiac Panel (last 3 results)  Basename 02/07/12 0124  CKTOTAL --  CKMB --  TROPONINI 1.49*  RELINDX --   DICTATION # [id number], [id number]  AO:120/70  LV:120/19  LM: nl  LAD: mild luminal irregularities proximally, 40% ostial DX1  LCx: nl  RCA: prox-mid 100% occlusion  Successful PCI to RCA with Asahi medium wire, 2.0 x 12 trek, 2.5 x 23 Xience  Xpedition DES stent, 2.75 x 15 [place] Trek with 100% to 0% and restoration of TIMI 3 flow.  EF 55% with minimal inferior hypokinesis  DTB time: 20 minutes  Angiomax, 180 mg brilinta, IC NTG.  Assessment/Plan  Principal Problem:  *STEMI (ST elevation myocardial infarction) Active Problems:  HTN (hypertension)  Obesity  Plan:  S/P PCI with DES to RCA.   Doing very well.  Sheath being pulled right now.  BP and labs stable.  Troponin to 1.49 so far.  ASA, ARB, lopressor statin.   Will need dietary consult and an outpatient.       LOS: 1 day    HAGER, BRYAN 02/07/2012 7:46 AM  I have seen and examined the patient along with HAGER, BRYAN, PA.  I have reviewed the chart, notes and new data.  I agree with PA's note.  Key new complaints: feels well Key examination changes: no groin complications; no clinical signs of HF  PLAN: Transfer telemetry. Home in 24-48 hours. Discussed role of dual antiplatelet therapy, purpose of beta blockers and statin, dietary and lifestyle changes, cardiac rehab in detail  Thurmon Fair, MD, [facility] [phone] 02/07/2012, 3:01 PM

## 2012-02-07 NOTE — Progress Notes (Signed)
RFA sheath removed at 0735 without complications. Pressure to site x 25nin. Site level zero. Dressing applied. Pt teaching done.Right posterior tibial pulse bounding. Bedrest begins at 0800.

## 2012-02-07 NOTE — Progress Notes (Signed)
Pt just walked with RN, apparently no problems. Resting now. Discussed MI, stent, restrictions, diet (including Hgb A1C). Pt sts he does not have PCP here in [place]. Voiced understanding and thankful for information. Will f/u in am. [phone] Ethelda Chick CES, ACSM

## 2012-02-08 DIAGNOSIS — E669 Obesity, unspecified: Secondary | ICD-10-CM | POA: Diagnosis present

## 2012-02-08 DIAGNOSIS — E1169 Type 2 diabetes mellitus with other specified complication: Secondary | ICD-10-CM | POA: Diagnosis present

## 2012-02-08 LAB — BASIC METABOLIC PANEL
BUN: 17 mg/dL (ref 6–23)
CO2: 26 mEq/L (ref 19–32)
Calcium: 9.5 mg/dL (ref 8.4–10.5)
Chloride: 102 mEq/L (ref 96–112)
Creatinine, Ser: 0.96 mg/dL (ref 0.50–1.35)
GFR calc Af Amer: >90 mL/min (ref >90)

## 2012-02-08 MED ORDER — NITROGLYCERIN 0.4 MG SL SUBL: 0.4000 mg | SUBLINGUAL_TABLET | SUBLINGUAL | Status: AC | PRN

## 2012-02-08 MED ORDER — SIMVASTATIN 40 MG PO TABS: 40.0000 mg | ORAL_TABLET | Freq: Every day | ORAL | Status: DC

## 2012-02-08 MED ORDER — TICAGRELOR 90 MG PO TABS: 90.0000 mg | ORAL_TABLET | Freq: Two times a day (BID) | ORAL | Status: DC

## 2012-02-08 MED ORDER — ASPIRIN 81 MG PO TBEC: 81.0000 mg | DELAYED_RELEASE_TABLET | Freq: Every day | ORAL | Status: AC

## 2012-02-08 MED ORDER — METOPROLOL TARTRATE 12.5 MG HALF TABLET: 12.5000 mg | ORAL_TABLET | Freq: Two times a day (BID) | ORAL | Status: DC

## 2012-02-08 MED FILL — Dextrose Inj 5%: INTRAVENOUS | Qty: 50 | Status: AC

## 2012-02-08 NOTE — Progress Notes (Addendum)
Pt. Seen and examined. Agree with the NP/PA-C note as written.  Groin stable. Chest pain free. Labs WNL. On appropriate meds. Rehab is evaluating. Ok for discharge home today. Follow-up with Dr. Tresa Endo.  Rx for prn nitro tabs to go home with.  Chrystie Nose, MD, [facility] Attending Cardiologist [facility]

## 2012-02-08 NOTE — Progress Notes (Signed)
[phone] Pt stated he walked earlier without chest pain and tolerated well. Education completed with pt and wife. Discussed counting carbs and heart healthy diet. Encouraged pt to follow up with primary doctor since HgA1C slightlly elevated. Discussed CRP 2 and pt gave permission to refer to [facility] Phase 2. Luetta Nutting RN

## 2012-02-08 NOTE — Discharge Summary (Signed)
Physician Discharge Summary  Patient ID: Corey Hardin MRN: [id number] DOB/AGE: 1950-05-06 62 y.o.  Admit date: 02/06/2012 Discharge date: 02/08/2012  Admission Diagnoses: STEMI  Discharge Diagnoses:  Principal Problem:  *STEMI (ST elevation myocardial infarction) Active Problems:  HTN (hypertension)  Obesity  CAD, RCA DES 02/06/12  Dyslipidemia, LDL 136- statin added  Diabetes mellitus type 2 in obese   Discharged Condition: stable  Hospital Course: The patient is a 62 y/o caucasian male, with a history significant for HTN, HLD and obesity, who was admitted on 2/3 for STEMI.  He initially presented to [facility] with a complaint of chest pain. An EKG, at time of presentation, showed inferior ST elevation with ST depressions in leads 1 and AVL. STEMI protocol was activated and the patient was transported to [facility]. He was taken to the cath lab urgently by Dr. Tresa Endo.  The cath revealed a totally occluded RCA. He underwent successful PCI and stenting to the RCA, using a DES. Door to balloon time was 20 minutes.  He had normal LV systolic function, with an estimated EF of 55%. Minimal inferior hypokinesis was noted. He left the cath lab in stable condition. He was later placed on telemetry for further monitoring/observation. He had no further chest pain. Troponin levels decreased. The right femoral access sight was stable. He was last seen and examined by Dr. Rennis Golden who felt he was stable for d/c home. He will be discharged on dual antiplatelet therapy with Ticagrelor and ASA.  He is also on a BB, an ARB, statin and SL NTG, prn.  He will have OP cardiac rehab, tentatively scheduled for 3 times per week. He will also have OP dietary consult with a dietician.  Follow up has been arranged with Dr. Tresa Endo on 2/20, at our [facility] office.  Consults: None  Significant Diagnostic Studies: Emergent Cardiac Catheterization 02/07/12 AO:120/70  LV:120/19  LM: nl  LAD: mild luminal irregularities  proximally, 40% ostial DX1  LCx: nl  RCA: prox-mid 100% occlusion  Successful PCI to RCA with Asahi medium wire, 2.0 x 12 trek, 2.5 x 23 Xience Xpedition DES stent, 2.75 x 15 Odum Trek with 100% to 0% and restoration of TIMI 3 flow.  EF 55% with minimal inferior hypokinesis  DTB time: 20 minutes  Angiomax, 180 mg brilinta, IC NTG.   Lennette Bihari, MD, [facility]  02/07/2012  1:10 AM   Treatments: See Hospital Course  Discharge Exam: Blood pressure 128/86, pulse 81, temperature 97.7 F (36.5 C), temperature source Oral, resp. rate 18, height 6\' 1"  (1.854 m), weight 320 lb (145.151 kg), SpO2 96.00%.   Disposition: Final discharge disposition not confirmed      Discharge Orders    Future Orders Please Complete By Expires   Amb Referral to Cardiac Rehabilitation      Diet - low sodium heart healthy      Increase activity slowly      Driving Restrictions      Comments:   No driving for 3 days   Lifting restrictions      Comments:   No lifting more than 1/2 gallon of milk for 3 days       Medication List     As of 02/08/2012  2:45 PM    TAKE these medications         aspirin 81 MG EC tablet   Take 1 tablet (81 mg total) by mouth daily.      metoprolol tartrate 12.5 mg Tabs   Commonly  known as: LOPRESSOR   Take 0.5 tablets (12.5 mg total) by mouth 2 (two) times daily.      nitroGLYCERIN 0.4 MG SL tablet   Commonly known as: NITROSTAT   Place 1 tablet (0.4 mg total) under the tongue every 5 (five) minutes x 3 doses as needed for chest pain.      olmesartan 20 MG tablet   Commonly known as: BENICAR   Take 20 mg by mouth daily.      simvastatin 40 MG tablet   Commonly known as: ZOCOR   Take 1 tablet (40 mg total) by mouth daily at 6 PM.      Ticagrelor 90 MG Tabs tablet   Commonly known as: BRILINTA   Take 1 tablet (90 mg total) by mouth 2 (two) times daily.      Ticagrelor 90 MG Tabs tablet   Commonly known as: BRILINTA   Take 1 tablet (90 mg total) by mouth 2 (two)  times daily.        Follow-up Information    Follow up with Lennette Bihari, MD. On 02/23/2012. (3:00 pm)    Contact information:   [address] [place] [address] [phone]          Signed: Robbie Lis 02/08/2012, 2:45 PM

## 2012-02-08 NOTE — Progress Notes (Signed)
[facility]  Subjective: Feeling great. Denies CP/SOB. Has been ambulating around unit w/o difficulty.   Objective: Vital signs in last 24 hours: Temp:  [97.7 F (36.5 C)-98.2 F (36.8 C)] 97.9 F (36.6 C) (02/05 0457) Pulse Rate:  [59-69] 66  (02/05 0457) Resp:  [10-18] 18  (02/05 0457) BP: (93-120)/(60-82) 110/67 mmHg (02/05 0457) SpO2:  [94 %-98 %] 96 % (02/05 0457) Last BM Date: 02/06/12  Intake/Output from previous day: 02/04 0701 - 02/05 0700 In: 1476 [P.O.:720; I.V.:756] Out: 1000 [Urine:1000] Intake/Output this shift:    Medications Current Facility-Administered Medications  Medication Dose Route Frequency Provider Last Rate Last Dose  . 0.9 %  sodium chloride infusion   Intravenous Continuous Carlisle Beers Molpus, MD 20 mL/hr at 02/06/12 2315    . 0.9 %  sodium chloride infusion  250 mL Intravenous PRN Abelino Derrick, PA      . 0.9 %  sodium chloride infusion   Intravenous Continuous Lennette Bihari, MD   125 mL/hr at 02/07/12 0140  . acetaminophen (TYLENOL) tablet 650 mg  650 mg Oral Q4H PRN Lennette Bihari, MD      . ALPRAZolam Prudy Feeler) tablet 0.25 mg  0.25 mg Oral BID PRN Abelino Derrick, PA      . aspirin EC tablet 81 mg  81 mg Oral Daily Lennette Bihari, MD   81 mg at 02/08/12 1007  . irbesartan (AVAPRO) tablet 75 mg  75 mg Oral Daily Eda Paschal [place], [place]   75 mg at 02/08/12 1007  . metoprolol tartrate (LOPRESSOR) tablet 12.5 mg  12.5 mg Oral BID Abelino Derrick, PA   12.5 mg at 02/08/12 1007  . morphine 2 MG/ML injection 2 mg  2 mg Intravenous Q1H PRN Lennette Bihari, MD      . nitroGLYCERIN (NITROSTAT) SL tablet 0.4 mg  0.4 mg Sublingual Q5 Min x 3 PRN Abelino Derrick, PA      . ondansetron [facility]) injection 4 mg  4 mg Intravenous Q6H PRN Lennette Bihari, MD      . pantoprazole (PROTONIX) EC tablet 40 mg  40 mg Oral Q0600 Eda Paschal [place], [place]   40 mg at 02/08/12 1610  . simvastatin (ZOCOR) tablet 40 mg  40 mg Oral q1800 Eda Paschal [place], [place]   40 mg at  02/07/12 1618  . sodium chloride 0.9 % injection 3 mL  3 mL Intravenous Q12H Eda Paschal [place], PA   3 mL at 02/08/12 1008  . sodium chloride 0.9 % injection 3 mL  3 mL Intravenous PRN Abelino Derrick, PA      . Ticagrelor (BRILINTA) tablet 90 mg  90 mg Oral BID Lennette Bihari, MD   90 mg at 02/08/12 1007  . traMADol (ULTRAM) tablet 50 mg  50 mg Oral Q6H PRN Abelino Derrick, PA      . zolpidem (AMBIEN) tablet 5 mg  5 mg Oral QHS PRN,MR X 1 Eda Paschal [place], [place]        PE: General appearance: alert, cooperative and no distress Lungs: clear to auscultation bilaterally Heart: regular rate and rhythm Extremities: no LEE Pulses: 2+ and symmetric Skin: warm and dry Neurologic: Grossly normal  Lab Results:   Basename 02/07/12 0510 02/07/12 0125 02/06/12 2305  WBC 10.7* 12.9* 14.0*  HGB 14.7 14.4 15.4  HCT 45.0 43.8 45.8  PLT 220 219 248   BMET  Basename 02/08/12 0607 02/07/12 0510 02/07/12  0125  NA 137 136 136  K 4.1 4.5 4.3  CL 102 100 100  CO2 26 27 26   GLUCOSE 116* 128* 159*  BUN 17 17 19   CREATININE 0.96 0.87 0.91  CALCIUM 9.5 10.1 10.0   PT/INR  Basename 02/06/12 2305  LABPROT 13.7  INR 1.06   Cholesterol  Basename 02/07/12 0125  CHOL 202*   Cardiac Panel (last 3 results)  Basename 02/07/12 2056 02/07/12 1533 02/07/12 0905  CKTOTAL -- -- --  CKMB -- -- --  TROPONINI 6.15* 7.76* 14.57*  RELINDX -- -- --    Assessment/Plan  Principal Problem:  *STEMI (ST elevation myocardial infarction) Active Problems:  HTN (hypertension)  Obesity  CAD, RCA DES 02/06/12  Dyslipidemia, LDL 136- statin added   Plan: Pt was admitted for STEMI on 02/07/12. S/P PCI to RCA w/ DES. POD#1. He is CP free.  He is on dual antiplatelet therapy with Brilinta and ASA. He is also on a BB, ARB and statin. HR and BP both stable. NSR on telemetry w/ no red alarms. Troponins are downtrending. All other labs WNL. He was seen by a dietician yesterday. He will be enrolled in OP cardiac rehab 3 x per week.  He does not have an established cardiologist, but would like to follow up with Dr. Tresa Endo if possible. D/C is possible either today or tomorrow. MD to follow to determine status and provide additional recommendation.    LOS: 2 days    Tamika Nou M. Sharol Harness, PA-C 02/08/2012 10:24 AM

## 2012-02-08 NOTE — Care Management Note (Signed)
    Page 1 of 1   02/08/2012     2:25:41 PM   CARE MANAGEMENT NOTE 02/08/2012  Patient:  Corey Hardin, Corey Hardin   Account Number:  [id number]  Date Initiated:  02/07/2012  Documentation initiated by:  [facility]  Subjective/Objective Assessment:   STEMI - cath lab.  Lives with spouse - recently moved to area.     Action/Plan:   Anticipated DC Date:  02/10/2012   Anticipated DC Plan:  HOME/SELF CARE      DC Planning Services  CM consult      Choice offered to / List presented to:             Status of service:  Completed, signed off Medicare Important Message given?   (If response is "NO", the following Medicare IM given date fields will be blank) Date Medicare IM given:   Date Additional Medicare IM given:    Discharge Disposition:  HOME/SELF CARE  Per UR Regulation:  Reviewed for med. necessity/level of care/duration of stay  If discussed at Long Length of Stay Meetings, dates discussed:    Comments:  ContactJamarian, Corey Hardin [phone]  02-07-12 [address], [place] [phone] CM did speak to pt and he uses [facility] on [address] and they have medicaiton available and co pay cost will be 18.00. CM provided pt with 30 day free card. No furhter needs from CM at this time.

## 2012-02-27 ENCOUNTER — Other Ambulatory Visit (HOSPITAL_COMMUNITY): Payer: Self-pay | Admitting: Cardiovascular Disease

## 2012-02-27 DIAGNOSIS — I2119 ST elevation (STEMI) myocardial infarction involving other coronary artery of inferior wall: Secondary | ICD-10-CM

## 2012-03-08 ENCOUNTER — Ambulatory Visit (HOSPITAL_COMMUNITY)
Admission: RE | Admit: 2012-03-08 | Discharge: 2012-03-08 | Disposition: A | Discharge status: Home / Self Care | Payer: BC Managed Care – PPO | Source: Ambulatory Visit | Attending: Cardiovascular Disease | Admitting: Cardiovascular Disease

## 2012-03-08 DIAGNOSIS — I25810 Atherosclerosis of coronary artery bypass graft(s) without angina pectoris: Secondary | ICD-10-CM | POA: Insufficient documentation

## 2012-03-08 DIAGNOSIS — I2119 ST elevation (STEMI) myocardial infarction involving other coronary artery of inferior wall: Secondary | ICD-10-CM

## 2012-03-08 MED ORDER — TECHNETIUM TC 99M SESTAMIBI GENERIC - CARDIOLITE
30.0000 | Freq: Once | INTRAVENOUS | Status: AC | PRN
  Administered 2012-03-08: 30 via INTRAVENOUS

## 2012-03-08 MED ORDER — REGADENOSON 0.4 MG/5ML IV SOLN
0.4000 mg | Freq: Once | INTRAVENOUS | Status: AC
  Administered 2012-03-08: 0.4 mg via INTRAVENOUS

## 2012-03-08 MED ORDER — TECHNETIUM TC 99M SESTAMIBI GENERIC - CARDIOLITE
10.0000 | Freq: Once | INTRAVENOUS | Status: AC | PRN
  Administered 2012-03-08: 10 via INTRAVENOUS

## 2012-03-08 NOTE — Procedures (Addendum)
[facility] [address] [place] [address] [phone]  Cardiology Nuclear Med Study  Corey Hardin is a 62 y.o. male     MRN : [id number]     DOB: 04-12-1950  Procedure Date: 03/08/2012  Nuclear Med Background Indication for Stress Test:  Stent Patency and Post Hospital History:  CAD;MI-02/2012;STENT/PTCA-02/2012 Cardiac Risk Factors: Hypertension  Symptoms:  Chest Pain, Fatigue and SOB   Nuclear Pre-Procedure Caffeine/Decaff Intake:  7:00pm NPO After: 5:00am   IV Site: R Hand  IV 0.9% NS with Angio Cath:  22g  Chest Size (in):  52" IV Started by: Emmit Pomfret, RN  Height: 6\' 1"  (1.854 m)  Cup Size: n/a  BMI:  Body mass index is 42.76 kg/(m^2). Weight:  324 lb (146.965 kg)   Tech Comments:  N/A    Nuclear Med Study 1 or 2 day study: 1 day  Stress Test Type:  Lexiscan  Order Authorizing Provider:  Benny Lennert   Resting Radionuclide: Technetium 4m Sestamibi  Resting Radionuclide Dose: 10.2 mCi   Stress Radionuclide:  Technetium 35m Sestamibi  Stress Radionuclide Dose: 31.9 mCi           Stress Protocol Rest HR: 62 Stress HR: 80  Rest BP: 124/75 Stress BP: 105/69  Exercise Time (min): n/a METS: n/a          Dose of Adenosine (mg):  n/a Dose of Lexiscan: 0.4 mg  Dose of Atropine (mg): n/a Dose of Dobutamine: n/a mcg/kg/min (at max HR)  Stress Test Technologist: Ernestene Mention, CCT Nuclear Technologist: Koren Shiver, CNMT   Rest Procedure:  Myocardial perfusion imaging was performed at rest 45 minutes following the intravenous administration of Technetium 73m Sestamibi. Stress Procedure:  The patient received IV Lexiscan 0.4 mg over 15-seconds.  Technetium 58m Sestamibi injected at 30-seconds.  There were no significant changes with Lexiscan.  Quantitative spect images were obtained after a 45 minute delay.  Transient Ischemic Dilatation (Normal <1.22):  0.90 Lung/Heart Ratio (Normal <0.45):  0.43 QGS  EDV:  80 ml QGS ESV:  25 ml LV Ejection Fraction: 68%  Signed by       Rest ECG: NSR - Normal EKG  Stress ECG: No significant change from baseline ECG  QPS Raw Data Images:  Normal; no motion artifact; normal heart/lung ratio. Stress Images:  Normal homogeneous uptake in all areas of the myocardium. Rest Images:  Normal homogeneous uptake in all areas of the myocardium. Subtraction (SDS):  No evidence of ischemia.  Impression Exercise Capacity:  Lexiscan with no exercise. BP Response:  Normal blood pressure response. Clinical Symptoms:  No significant symptoms noted. ECG Impression:  No significant ST segment change suggestive of ischemia. Comparison with Prior Nuclear Study: No images to compare  Overall Impression:  Normal stress nuclear study.  LV Wall Motion:  NL LV Function; NL Wall Motion   Runell Gess, MD  03/08/2012 1:02 PM

## 2012-03-08 NOTE — Progress Notes (Signed)
[facility]   2D echo completed 03/08/2012.   Cindy Rigg, RDCS  

## 2012-05-23 ENCOUNTER — Ambulatory Visit: Payer: BC Managed Care – PPO | Admitting: Cardiovascular Disease

## 2012-06-12 ENCOUNTER — Ambulatory Visit: Payer: BC Managed Care – PPO | Admitting: Cardiovascular Disease

## 2012-06-12 ENCOUNTER — Encounter: Payer: Self-pay | Admitting: Cardiovascular Disease

## 2012-06-26 ENCOUNTER — Ambulatory Visit: Payer: BC Managed Care – PPO | Admitting: Cardiovascular Disease

## 2012-07-10 ENCOUNTER — Ambulatory Visit (INDEPENDENT_AMBULATORY_CARE_PROVIDER_SITE_OTHER): Payer: BC Managed Care – PPO | Admitting: Cardiovascular Disease

## 2012-07-10 ENCOUNTER — Encounter: Payer: Self-pay | Admitting: Cardiovascular Disease

## 2012-07-10 VITALS — BP 110/70 | HR 72 | Ht 73.0 in | Wt 310.5 lb

## 2012-07-10 DIAGNOSIS — I2510 Atherosclerotic heart disease of native coronary artery without angina pectoris: Secondary | ICD-10-CM

## 2012-07-10 MED ORDER — SIMVASTATIN 40 MG PO TABS: 40.0000 mg | ORAL_TABLET | Freq: Every day | ORAL | Status: AC

## 2012-07-10 MED ORDER — METOPROLOL SUCCINATE ER 50 MG PO TB24: 50.0000 mg | ORAL_TABLET | Freq: Every day | ORAL | Status: DC

## 2012-07-10 NOTE — Patient Instructions (Addendum)
Your physician recommends that you schedule a follow-up appointment in: 6 MONTHS.  Your physician has recommended you make the following change in your medication: When you finish the twice a day metoprolol then start the new prescription for the once a day dose.

## 2012-07-10 NOTE — Progress Notes (Signed)
Patient ID: Corey Hardin, male   DOB: 08/03/1950, 62 y.o.   MRN: [id number]     HPI: Corey Hardin, is a 62 y.o. male presents to the office today for cardiology follow up evaluation. Corey Hardin is a 70-year-old gentleman who presented to [facility] the middle of the night on February 3 early morning every 4 2014 in the setting of an ST segment elevation of her wall myocardial infarction [facility]. He is moved to this area from [place]. He has a history of hypertension as well as history of obesity. He was taken acutely to the cardiac catheterization laboratory by me which revealed total occlusion of the proximal to mid right coronary artery. Mild luminal irregularities of the LAD with 40% ostial diagonal stenosis. 2 normal circumflex coronary artery. He underwent successful particular intervention to his total colluded right carotid artery and ultimately had a 2.5x23 mm I'm CES expeditions stent inserted which was postdilated to 0.71 mm, dome of bivalirudin/180 mg per limb to IC nitroglycerin. His brother to balloon time was excellent at 20 minutes. He did develop recurrent chest pain possibly one month later and was seen in the office by leucovorin. A subsequent nuclear perfusion study revealed complete salvage of myocardium without evidence for residual scar or ischemia. He saw Delfin Gant on 04/14/1998 1430 presents to the office today for carotid evaluation with me.  Presently, Corey Hardin feels well. He denies recurrent anginal symptoms. He tells me yesterday he saw a physician was having a right Baker's cyst posterior to his right knee.  Past Medical History  Diagnosis Date  . Hypertension     NUCLEAR STRESS TEST, 03/08/2012 - normal  . Acute MI, inferior wall     2D ECHO, 03/08/2012 - EF 65-70%, normal    Past Surgical History  Procedure Laterality Date  . Neck surgery    . Knee surgery    . Hernia repair      Rt groin  . Cardiac catheterization   02/06/2012    RCA 2.5x47mm Xience DES Xpedition stent, resulting in a reduction of a 100% occlusion to 0%    No Known Allergies  Current Outpatient Prescriptions  Medication Sig Dispense Refill  . aspirin EC 81 MG EC tablet Take 1 tablet (81 mg total) by mouth daily.      . benazepril (LOTENSIN) 20 MG tablet Take 20 mg by mouth daily.      . metoprolol tartrate (LOPRESSOR) 25 MG tablet Take 25 mg by mouth 2 (two) times daily.      . nitroGLYCERIN (NITROSTAT) 0.4 MG SL tablet Place 1 tablet (0.4 mg total) under the tongue every 5 (five) minutes x 3 doses as needed for chest pain.  25 tablet  5  . simvastatin (ZOCOR) 40 MG tablet Take 1 tablet (40 mg total) by mouth daily at 6 PM.  30 tablet  5  . Ticagrelor (BRILINTA) 90 MG TABS tablet Take 1 tablet (90 mg total) by mouth 2 (two) times daily.  60 tablet  10  . metoprolol succinate (TOPROL-XL) 50 MG 24 hr tablet Take 1 tablet (50 mg total) by mouth daily. Take with or immediately following a meal.  90 tablet  3   No current facility-administered medications for this visit.    Socially he's married has 4 children 2 grandchildren. No tobacco or alcohol use. With his Baker's cyst is not able to walk a significant amount but intends to resume his activity level once this  resolves.  ROS is negative for fevers, chills or night sweats. He does admit to some weight loss. He denies chest pressure. He denies PND or orthopnea. He denies rashes. He denies bleeding. He denies GERD symptoms. He denies dysuria. He denies neurologic symptoms.   Other system review is negative.  PE BP 110/70  Pulse 72  Ht 6\' 1"  (1.854 m)  Wt 310 lb 8 oz (140.842 kg)  BMI 40.97 kg/m2  General: Alert, oriented, no distress.  Skin: normal turgor, no rashes HEENT: Normocephalic, atraumatic. Pupils round and reactive; sclera anicteric;no lid lag.  Nose without nasal septal hypertrophy Mouth/Parynx benign; Mallinpatti scale 3 Neck: No JVD, no carotid briuts Lungs: clear to  ausculatation and percussion; no wheezing or rales Heart: RRR, s1 s2 normal, no S3 gallop. No murmur thrills or heaves. Abdomen: soft, nontender; no hepatosplenomehaly, BS+; abdominal aorta nontender and not dilated by palpation. Pulses 2+ Extremities: no clubbing cyanosis or edema, Homan's sign negative  Neurologic: grossly nonfocal  ECG: Sinus rhythm at 72 beats per minute. There is a very small nondiagnostic Q wave in 3 with T-wave inversion.  EKG does not meet criteria for inferior infarction.  LABS:  BMET    Component Value Date/Time   NA 137 02/08/2012 0607   K 4.1 02/08/2012 0607   CL 102 02/08/2012 0607   CO2 26 02/08/2012 0607   GLUCOSE 116* 02/08/2012 0607   BUN 17 02/08/2012 0607   CREATININE 0.96 02/08/2012 0607   CALCIUM 9.5 02/08/2012 0607   GFRNONAA 88* 02/08/2012 0607   GFRAA >90 02/08/2012 0607     Hepatic Function Panel     Component Value Date/Time   PROT 7.5 02/06/2012 2305   ALBUMIN 3.9 02/06/2012 2305   AST 16 02/06/2012 2305   ALT 16 02/06/2012 2305   ALKPHOS 56 02/06/2012 2305   BILITOT 0.3 02/06/2012 2305     CBC    Component Value Date/Time   WBC 10.7* 02/07/2012 0510   RBC 4.93 02/07/2012 0510   HGB 14.7 02/07/2012 0510   HCT 45.0 02/07/2012 0510   PLT 220 02/07/2012 0510   MCV 91.3 02/07/2012 0510   MCH 29.8 02/07/2012 0510   MCHC 32.7 02/07/2012 0510   RDW 13.2 02/07/2012 0510     BNP No results found for this basename: probnp    Lipid Panel     Component Value Date/Time   CHOL 202* 02/07/2012 0125   TRIG 106 02/07/2012 0125   HDL 45 02/07/2012 0125   CHOLHDL 4.5 02/07/2012 0125   VLDL 21 02/07/2012 0125   LDLCALC 136* 02/07/2012 0125     RADIOLOGY: No results found.    ASSESSMENT AND PLAN:  Corey Hardin is now 4 months following his inferior wall ST segment elevation myocardial infarction which he had excellent balloon, 20 minutes from arrival to the hospital in transfer [facility]. Subsequent nuclear perfusion imaging showed complete salvage of myocardium  without scar or ischemia. We did discuss a further portions of weight loss. He has lost 12 pounds since April. He is now on lipid-lowering therapy consisting of simvastatin 40 mg. Subsequent to a lipid panel on 03/08/2012 showed significant improvement close to 1:30 nitroglycerin 1:30 HDL 43 LDL 70.  When he runs out of his metoprolol titrate 25 mg twice a day I am switching him to metoprolol succinate 50 mg daily. I'll see him back in the office in December 2014  for further evaluation.    Lennette Bihari, MD, [facility]  07/10/2012 5:21 PM

## 2012-09-10 ENCOUNTER — Telehealth: Payer: Self-pay | Admitting: Cardiovascular Disease

## 2012-09-10 NOTE — Telephone Encounter (Signed)
Needss a pulmonary function test-wants to know if this is all right with Dr Tresa Endo?

## 2012-09-10 NOTE — Telephone Encounter (Signed)
Returned call.  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  Stated he read somewhere that if you had a recent heart attack, then you shouldn't have the PFT.  Pt stated the training starts in October, but his next appt isn't until Jan.  Stated there is another class in February, if Dr. Tresa Endo thinks he should wait until after he sees him to clear him for the test.  Pt informed Dr. Tresa Endo will be notified and he will be contacted when a response is given.  Pt verbalized understanding and agreed w/ plan.  Message forwarded to Dr. Tresa Endo.

## 2012-09-12 NOTE — Telephone Encounter (Signed)
Patient has called back to see if Dr. Tresa Endo has responded.  He needs an answer as soon as possible.

## 2012-09-12 NOTE — Telephone Encounter (Signed)
Message forwarded to Dr. Kelly.

## 2012-09-13 NOTE — Telephone Encounter (Signed)
Ok to have PFT

## 2012-09-13 NOTE — Telephone Encounter (Signed)
Returned call and informed pt per instructions by MD/PA.  Pt verbalized understanding and agreed w/ plan.  

## 2012-10-08 ENCOUNTER — Ambulatory Visit (INDEPENDENT_AMBULATORY_CARE_PROVIDER_SITE_OTHER): Payer: BC Managed Care – HMO | Admitting: Family Medicine

## 2012-10-08 ENCOUNTER — Encounter: Payer: Self-pay | Admitting: Family Medicine

## 2012-10-08 ENCOUNTER — Ambulatory Visit: Payer: BC Managed Care – HMO

## 2012-10-08 VITALS — BP 130/84 | HR 63 | Temp 98.0°F | Resp 18 | Ht 71.75 in | Wt 318.6 lb

## 2012-10-08 DIAGNOSIS — R0600 Dyspnea, unspecified: Secondary | ICD-10-CM

## 2012-10-08 DIAGNOSIS — J209 Acute bronchitis, unspecified: Secondary | ICD-10-CM

## 2012-10-08 DIAGNOSIS — I2510 Atherosclerotic heart disease of native coronary artery without angina pectoris: Secondary | ICD-10-CM

## 2012-10-08 DIAGNOSIS — R05 Cough: Secondary | ICD-10-CM

## 2012-10-08 DIAGNOSIS — R053 Chronic cough: Secondary | ICD-10-CM

## 2012-10-08 DIAGNOSIS — R5381 Other malaise: Secondary | ICD-10-CM

## 2012-10-08 DIAGNOSIS — I10 Essential (primary) hypertension: Secondary | ICD-10-CM

## 2012-10-08 DIAGNOSIS — R059 Cough, unspecified: Secondary | ICD-10-CM

## 2012-10-08 DIAGNOSIS — R0609 Other forms of dyspnea: Secondary | ICD-10-CM

## 2012-10-08 LAB — POCT CBC
HCT, POC: 48.7 % (ref 43.5–53.7)
Hemoglobin: 15.2 g/dL (ref 14.1–18.1)
Lymph, poc: 2.8 (ref 0.6–3.4)
MCH, POC: 30 pg (ref 27–31.2)
MCHC: 31.2 g/dL — AB (ref 31.8–35.4)
MPV: 10.5 fL (ref 0–99.8)
POC Granulocyte: 5.1 (ref 2–6.9)
Platelet Count, POC: 278 10*3/uL (ref 142–424)
RDW, POC: 14 %
WBC: 8.6 10*3/uL (ref 4.6–10.2)

## 2012-10-08 MED ORDER — OMEPRAZOLE 20 MG PO CPDR: 20.0000 mg | DELAYED_RELEASE_CAPSULE | Freq: Every day | ORAL | Status: DC

## 2012-10-08 MED ORDER — AZITHROMYCIN 250 MG PO TABS: ORAL_TABLET | ORAL | Status: DC

## 2012-10-08 MED ORDER — ALBUTEROL SULFATE HFA 108 (90 BASE) MCG/ACT IN AERS: 2.0000 | INHALATION_SPRAY | Freq: Four times a day (QID) | RESPIRATORY_TRACT | Status: AC | PRN

## 2012-10-08 NOTE — Patient Instructions (Signed)
Avoid spicy foods, caffeine as this can increase reflux/heartburn that can cause cough.  Start omeprazole, z pak as discussed., inhaler if needed.  Return to the clinic or go to the nearest emergency room if any of your symptoms worsen or new symptoms occur. Cough, Adult  A cough is a reflex that helps clear your throat and airways. It can help heal the body or may be a reaction to an irritated airway. A cough may only last 2 or 3 weeks (acute) or may last more than 8 weeks (chronic).  CAUSES Acute cough:  Viral or bacterial infections. Chronic cough:  Infections.  Allergies.  Asthma.  Post-nasal drip.  Smoking.  Heartburn or acid reflux.  Some medicines.  Chronic lung problems (COPD).  Cancer. SYMPTOMS   Cough.  Fever.  Chest pain.  Increased breathing rate.  High-pitched whistling sound when breathing (wheezing).  Colored mucus that you cough up (sputum). TREATMENT   A bacterial cough may be treated with antibiotic medicine.  A viral cough must run its course and will not respond to antibiotics.  Your caregiver may recommend other treatments if you have a chronic cough. HOME CARE INSTRUCTIONS   Only take over-the-counter or prescription medicines for pain, discomfort, or fever as directed by your caregiver. Use cough suppressants only as directed by your caregiver.  Use a cold steam vaporizer or humidifier in your bedroom or home to help loosen secretions.  Sleep in a semi-upright position if your cough is worse at night.  Rest as needed.  Stop smoking if you smoke. SEEK IMMEDIATE MEDICAL CARE IF:   You have pus in your sputum.  Your cough starts to worsen.  You cannot control your cough with suppressants and are losing sleep.  You begin coughing up blood.  You have difficulty breathing.  You develop pain which is getting worse or is uncontrolled with medicine.  You have a fever. MAKE SURE YOU:   Understand these instructions.  Will  watch your condition.  Will get help right away if you are not doing well or get worse. Document Released: 06/18/2010 Document Revised: 03/14/2011 Document Reviewed: 06/18/2010 [facility] Patient Information 2014 [place], [place].

## 2012-10-08 NOTE — Progress Notes (Signed)
Subjective:    Patient ID: Corey Hardin, male    DOB: 1950/05/31, 62 y.o.   MRN: [id number]  HPI Corey Hardin is a 62 y.o. male PCP: Deneen Harts in [place] in past, but only seen once, no regular primary care.  Hx of CAD, s/p PTCA after STEMI in 02/2012,  HTN.   Seen for occupational physical 09/19/12 - FVC 77, FEV1 75.  Recurrent cough with illnesses going to chest on respiratory history. Admitted to cough on most days at that ov. Dry cough throughout day. No known hx of lung dz. Possible restrictive pattern on spirometry, and recommended eval with primary provider. Here for follow up of these findings to discuss with me.   Feels more cough past 3 weeks, dry cough. Feels tired/worn out. No chest pains. Occasional short of breath - sometimes at rest, some with activity, but not everytime.   Did have pneumonia few years ago, feels drained like that time. No hospitalizations.  Pleurisy twice on past, dry cough at times for 15 years?  No swelling in legs. No fever. Notes cough as soon as wakes up past few weeks. No nasal congestion, sneeze, or PND. wheeze comes and goes after coughing spell. Denies heartburn.   Takes benazepril 20mg  QD for HTN past 4 years only, and had cough prior to that.   No hx of tobacco abuse, possible secondh No known asbestos exposure.  No recent prolonged car travel or air travel, no recent calf pain or swelling.   FH: father: emphysema, lung cancer at 58yo (smoker), mother: lung cancer at 74yo (smoker).     Review of Systems  Constitutional: Positive for fatigue. Negative for fever and chills.  HENT: Negative for congestion, sore throat, rhinorrhea, sneezing, mouth sores, trouble swallowing, postnasal drip and sinus pressure.   Respiratory: Positive for cough, chest tightness (in lungs only - congestion with exhale, no chest pain. ), shortness of breath and wheezing.   Cardiovascular: Negative for chest pain, palpitations and leg swelling.  Skin: Negative for  color change and rash.       Objective:   Physical Exam  Vitals reviewed. Constitutional: He is oriented to person, place, and time. He appears well-developed and well-nourished.  Overweight/obese.   HENT:  Head: Normocephalic and atraumatic.  Right Ear: Tympanic membrane, external ear and ear canal normal.  Left Ear: Tympanic membrane, external ear and ear canal normal.  Nose: No rhinorrhea.  Mouth/Throat: Oropharynx is clear and moist and mucous membranes are normal. No oropharyngeal exudate or posterior oropharyngeal erythema.  Eyes: Conjunctivae are normal. Pupils are equal, round, and reactive to light.  Neck: Neck supple. No JVD present. Carotid bruit is not present.  Cardiovascular: Normal rate, regular rhythm, normal heart sounds and intact distal pulses.  Exam reveals no gallop and no friction rub.   No murmur heard. Distant but no apparent murmur.   Pulmonary/Chest: Effort normal and breath sounds normal. He has no wheezes (faint end expiratory after cough. dry cough during ov at times. ). He has no rhonchi. He has no rales.  Abdominal: Soft. There is no tenderness.  Musculoskeletal:       Right lower leg: He exhibits no tenderness and no swelling.       Left lower leg: He exhibits no tenderness and no swelling.  Lymphadenopathy:    He has no cervical adenopathy.  Neurological: He is alert and oriented to person, place, and time.  Skin: Skin is warm and dry. No rash noted.  Psychiatric: He  has a normal mood and affect. His behavior is normal.    EKG: SR, nonspecific TWI in III, but no apparent changes from 07/10/12.   [facility] reading (PRIMARY) by  Dr. Neva Seat: few increased markings vs early infiltrate RML/RLL.   Results for orders placed in visit on 10/08/12  POCT CBC      Result Value Range   WBC 8.6  4.6 - 10.2 K/uL   Lymph, poc 2.8  0.6 - 3.4   POC LYMPH PERCENT 32.4  10 - 50 %L   MID (cbc) 0.7  0 - 0.9   POC MID % 7.9  0 - 12 %M   POC Granulocyte 5.1  2 - 6.9    Granulocyte percent 59.7  37 - 80 %G   RBC 5.07  4.69 - 6.13 M/uL   Hemoglobin 15.2  14.1 - 18.1 g/dL   HCT, POC 16.1  09.6 - 53.7 %   MCV 96.1  80 - 97 fL   MCH, POC 30.0  27 - 31.2 pg   MCHC 31.2 (*) 31.8 - 35.4 g/dL   RDW, POC 04.5     Platelet Count, POC 278  142 - 424 K/uL   MPV 10.5  0 - 99.8 fL      Assessment & Plan:  Corey Hardin is a 61 y.o. male Cough - Plan: DG Chest 2 View, POCT CBC, Angiotensin converting enzyme, Brain natriuretic peptide, EKG 12-Lead, azithromycin (ZITHROMAX) 250 MG tablet, albuterol (PROVENTIL HFA;VENTOLIN HFA) 108 (90 BASE) MCG/ACT inhaler, Ambulatory referral to Pulmonology, omeprazole (PRILOSEC) 20 MG capsule  Dyspnea - Plan: DG Chest 2 View, Angiotensin converting enzyme, Brain natriuretic peptide, EKG 12-Lead, albuterol (PROVENTIL HFA;VENTOLIN HFA) 108 (90 BASE) MCG/ACT inhaler, Ambulatory referral to Pulmonology  Chronic cough - Plan: DG Chest 2 View, Angiotensin converting enzyme, Brain natriuretic peptide, Ambulatory referral to Pulmonology, omeprazole (PRILOSEC) 20 MG capsule  Other malaise and fatigue - Plan: DG Chest 2 View, POCT CBC  CAD (coronary artery disease) - Plan: EKG 12-Lead  Acute bronchitis - Plan: azithromycin (ZITHROMAX) 250 MG tablet    Acute on Chronic cough with low FVC and FEV 1 on prior PFT's. Fatigue and possible secondary bronchitis vs CAP past few weeks. Doubt cardiac cause at present, but Er/911 precautions given.   Nonsmoker, but FH of lung cancer and possible prior secondhand smoke. Possible OHS component to restrictive component with overweight/obesity, and possible LPR with chronic cough, but denies reflux symptoms. Cough prior to ACE-I, but dry cough could be ACE-I component. -refer to pulmonary for eval.  -Will Rx prilosec 20mg  QD -check BNP, ACE level.  - proair if needed for wheeze.  -Zpak #1. Recheck if not improving in next 1 week., sooner if worse, otherwise can follow up after pulm eval.   Meds ordered  this encounter  Medications  . azithromycin (ZITHROMAX) 250 MG tablet    Sig: Take 2 pills by mouth on day 1, then 1 pill by mouth per day on days 2 through 5.    Dispense:  6 each    Refill:  0  . albuterol (PROVENTIL HFA;VENTOLIN HFA) 108 (90 BASE) MCG/ACT inhaler    Sig: Inhale 2 puffs into the lungs every 6 (six) hours as needed for wheezing.    Dispense:  1 Inhaler    Refill:  0  . omeprazole (PRILOSEC) 20 MG capsule    Sig: Take 1 capsule (20 mg total) by mouth daily.    Dispense:  30 capsule  Refill:  3   Patient Instructions  Avoid spicy foods, caffeine as this can increase reflux/heartburn that can cause cough.  Start omeprazole, z pak as discussed., inhaler if needed.  Return to the clinic or go to the nearest emergency room if any of your symptoms worsen or new symptoms occur. Cough, Adult  A cough is a reflex that helps clear your throat and airways. It can help heal the body or may be a reaction to an irritated airway. A cough may only last 2 or 3 weeks (acute) or may last more than 8 weeks (chronic).  CAUSES Acute cough:  Viral or bacterial infections. Chronic cough:  Infections.  Allergies.  Asthma.  Post-nasal drip.  Smoking.  Heartburn or acid reflux.  Some medicines.  Chronic lung problems (COPD).  Cancer. SYMPTOMS   Cough.  Fever.  Chest pain.  Increased breathing rate.  High-pitched whistling sound when breathing (wheezing).  Colored mucus that you cough up (sputum). TREATMENT   A bacterial cough may be treated with antibiotic medicine.  A viral cough must run its course and will not respond to antibiotics.  Your caregiver may recommend other treatments if you have a chronic cough. HOME CARE INSTRUCTIONS   Only take over-the-counter or prescription medicines for pain, discomfort, or fever as directed by your caregiver. Use cough suppressants only as directed by your caregiver.  Use a cold steam vaporizer or humidifier in your  bedroom or home to help loosen secretions.  Sleep in a semi-upright position if your cough is worse at night.  Rest as needed.  Stop smoking if you smoke. SEEK IMMEDIATE MEDICAL CARE IF:   You have pus in your sputum.  Your cough starts to worsen.  You cannot control your cough with suppressants and are losing sleep.  You begin coughing up blood.  You have difficulty breathing.  You develop pain which is getting worse or is uncontrolled with medicine.  You have a fever. MAKE SURE YOU:   Understand these instructions.  Will watch your condition.  Will get help right away if you are not doing well or get worse. Document Released: 06/18/2010 Document Revised: 03/14/2011 Document Reviewed: 06/18/2010 [facility] Patient Information 2014 [place], [place].

## 2012-10-09 LAB — BRAIN NATRIURETIC PEPTIDE: Brain Natriuretic Peptide: 39.4 pg/mL (ref 0.0–100.0)

## 2012-10-16 ENCOUNTER — Encounter: Payer: Self-pay | Admitting: Internal Medicine

## 2012-10-16 ENCOUNTER — Ambulatory Visit (INDEPENDENT_AMBULATORY_CARE_PROVIDER_SITE_OTHER): Payer: BC Managed Care – PPO | Admitting: Internal Medicine

## 2012-10-16 VITALS — BP 130/84 | HR 61 | Temp 97.9°F | Ht 73.0 in | Wt 325.0 lb

## 2012-10-16 DIAGNOSIS — R05 Cough: Secondary | ICD-10-CM

## 2012-10-16 DIAGNOSIS — I10 Essential (primary) hypertension: Secondary | ICD-10-CM

## 2012-10-16 MED ORDER — TELMISARTAN 40 MG PO TABS: 40.0000 mg | ORAL_TABLET | Freq: Every day | ORAL | Status: DC

## 2012-10-16 NOTE — Progress Notes (Signed)
  Subjective:    Patient ID: Corey Hardin, male    DOB: 07-28-1950   MRN: [id number]  HPI  22 yowm never smoker with seasonal rhinitis s asthma in 5th grade and outgrew it p HS with summer time flares with no assoc cough then noted persistent mild cough around 2000 and flunked pft Oct 2014 so referred to pulmonary clinic 10/16/2012 by Dr Neva Seat  10/16/2012 1st [facility] office visit/ Wert on acei cc cough x 15 years esp fall and winter and two bad bouts of bronchitis per year coughs daily dry hack better in summer and assoc doe /subj wheezing no better on saba or ppi to date..   Doe x more than mod paced walk.  No obvious other pattern in day to day or daytime variabilty or assoc   cp or chest tightness, overt sinus or hb symptoms. No unusual exp hx or h/o childhood pna/ asthma or knowledge of premature birth.  Sleeping ok without nocturnal  or early am exacerbation  of respiratory  c/o's or need for noct saba. Also denies any obvious fluctuation of symptoms with weather or environmental changes or other aggravating or alleviating factors except as outlined above   Current Medications, Allergies, Complete Past Medical History, Past Surgical History, Family History, and Social History were reviewed in [employer] record.     Review of Systems  Constitutional: Negative for fever and unexpected weight change.  HENT: Negative for congestion, dental problem, ear pain, nosebleeds, postnasal drip, rhinorrhea, sinus pressure, sneezing, sore throat and trouble swallowing.   Eyes: Negative for redness and itching.  Respiratory: Positive for cough, shortness of breath and wheezing. Negative for chest tightness.   Cardiovascular: Negative for palpitations and leg swelling.  Gastrointestinal: Negative for nausea and vomiting.  Genitourinary: Negative for dysuria.  Musculoskeletal: Negative for joint swelling.  Skin: Negative for rash.  Neurological: Negative for headaches.   Hematological: Does not bruise/bleed easily.  Psychiatric/Behavioral: Negative for dysphoric mood. The patient is not nervous/anxious.        Objective:   Physical Exam  Wt Readings from Last 3 Encounters:  10/16/12 325 lb (147.419 kg)  10/08/12 318 lb 9.6 oz (144.516 kg)  07/10/12 310 lb 8 oz (140.842 kg)     amb obese wm with classic pseudowheeze   HEENT: nl dentition, turbinates, and orophanx. Nl external ear canals without cough reflex   NECK :  without JVD/Nodes/TM/ nl carotid upstrokes bilaterally   LUNGS: no acc muscle use, clear to A and P bilaterally without cough on insp or exp maneuvers   CV:  RRR  no s3 or murmur or increase in P2, no edema   ABD:  soft and nontender with nl excursion in the supine position. No bruits or organomegaly, bowel sounds nl  MS:  warm without deformities, calf tenderness, cyanosis or clubbing  SKIN: warm and dry without lesions    NEURO:  alert, approp, no deficits    10/08/12 No acute cardiopulmonary abnormality.           Assessment & Plan:

## 2012-10-16 NOTE — Patient Instructions (Signed)
Stop lotensin  Start micardis 40 mg one daily in place of lotensin  And recheck your blood pressure in a week and call if not ok   Continue prilosec 20 mg Take 30-60 min before first meal of the day until 100% then try off   GERD (REFLUX)  is an extremely common cause of respiratory symptoms, many times with no significant heartburn at all.    It can be treated with medication, but also with lifestyle changes including avoidance of late meals, excessive alcohol, smoking cessation, and avoid fatty foods, chocolate, peppermint, colas, red wine, and acidic juices such as orange juice.  NO MINT OR MENTHOL PRODUCTS SO NO COUGH DROPS  USE SUGARLESS CANDY INSTEAD (jolley ranchers or Stover's)  NO OIL BASED VITAMINS - use powdered substitutes.  Please schedule a follow up office visit in 4 weeks, sooner if needed

## 2012-10-17 DIAGNOSIS — R05 Cough: Secondary | ICD-10-CM | POA: Insufficient documentation

## 2012-10-17 NOTE — Assessment & Plan Note (Addendum)
The most common causes of chronic cough in immunocompetent adults include the following: upper airway cough syndrome (UACS), previously referred to as postnasal drip syndrome (PNDS), which is caused by variety of rhinosinus conditions; (2) asthma; (3) GERD; (4) chronic bronchitis from cigarette smoking or other inhaled environmental irritants; (5) nonasthmatic eosinophilic bronchitis; and (6) bronchiectasis.   These conditions, singly or in combination, have accounted for up to 94% of the causes of chronic cough in prospective studies.   Other conditions have constituted no >6% of the causes in prospective studies These have included bronchogenic carcinoma, chronic interstitial pneumonia, sarcoidosis, left ventricular failure, ACEI-induced cough, and aspiration from a condition associated with pharyngeal dysfunction.    Chronic cough is often simultaneously caused by more than one condition. A single cause has been found from 38 to 82% of the time, multiple causes from 18 to 62%. Multiply caused cough has been the result of three diseases up to 42% of the time.       Most likely this is  Classic Upper airway cough syndrome, so named because it's frequently impossible to sort out how much is  CR/sinusitis with freq throat clearing (which can be related to primary GERD)   vs  causing  secondary (" extra esophageal")  GERD from wide swings in gastric pressure that occur with throat clearing, often  promoting self use of mint and menthol lozenges that reduce the lower esophageal sphincter tone and exacerbate the problem further in a cyclical fashion.   These are the same pts (now being labeled as having "irritable larynx syndrome" by some cough centers) who not infrequently have a history of having failed to tolerate ace inhibitors,  dry powder inhalers or biphosphonates or report having atypical reflux symptoms that don't respond to standard doses of PPI , and are easily confused as having aecopd or asthma  flares by even experienced allergists/ pulmonologists.  For now try off acei, continue ppi and diet, and f/u in 4 -6 weeks with pfts

## 2012-10-17 NOTE — Assessment & Plan Note (Signed)
ACE inhibitors are problematic in  pts with airway complaints because  even experienced pulmonologists can't always distinguish ace effects from copd/asthma.  By themselves they don't actually cause a problem, much like oxygen can't by itself start a fire, but they certainly serve as a powerful catalyst or enhancer for any "fire"  or inflammatory process in the upper airway, be it caused by an ET  tube or more commonly reflux (especially in the obese or pts with known GERD or who are on biphoshonates).    In the era of ARB near equivalency until we have a better handle on the reversibility of the airway problem, it just makes sense to avoid ACEI  entirely in the short run and then decide later, having established a level of airway control using a reasonable limited regimen, whether to add back acei but even then being very careful to observe the pt for worsening airway control and number of meds used/ needed to control symptoms.    For now asked Corey Hardin to start micardis 40 mg daily and stop lotensin

## 2012-11-13 ENCOUNTER — Ambulatory Visit (INDEPENDENT_AMBULATORY_CARE_PROVIDER_SITE_OTHER): Payer: BC Managed Care – PPO | Admitting: Internal Medicine

## 2012-11-13 ENCOUNTER — Encounter: Payer: Self-pay | Admitting: Internal Medicine

## 2012-11-13 VITALS — BP 128/80 | HR 60 | Temp 97.7°F | Ht 73.0 in | Wt 330.4 lb

## 2012-11-13 DIAGNOSIS — R05 Cough: Secondary | ICD-10-CM

## 2012-11-13 DIAGNOSIS — I10 Essential (primary) hypertension: Secondary | ICD-10-CM

## 2012-11-13 MED ORDER — TELMISARTAN 80 MG PO TABS: 80.0000 mg | ORAL_TABLET | Freq: Every day | ORAL | Status: AC

## 2012-11-13 NOTE — Progress Notes (Signed)
Subjective:    Patient ID: Corey Hardin, male    DOB: 1951/01/02   MRN: [id number]   Brief patient profile:  32 yowm never smoker with seasonal rhinitis s asthma in 5th grade and outgrew it p HS with summer time flares with no assoc cough then noted persistent mild cough around 2000 and flunked pft Oct 2014 so referred to pulmonary clinic 10/16/2012 by Dr Neva Seat   History of Present Illness  10/16/2012 1st [facility] office visit/ Corey Hardin on acei cc cough x 15 years esp fall and winter and two bad bouts of bronchitis per year coughs daily dry hack better in summer and assoc doe /subj wheezing no better on saba or ppi to date..   Doe x more than mod paced walk. rec Stop lotensin Start micardis 40 mg one daily in place of lotensin  And recheck your blood pressure in a week and call if not ok  Continue prilosec 20 mg Take 30-60 min before first meal of the day until 100% then try off  GERD diet     11/13/2012 f/u ov/Corey Hardin re: cough/ sob Chief Complaint  Patient presents with  . Follow-up    Pt states that his SOB has improved some. His cough has almost resolved completely. No new co's today.     Improved to his satisfaction, out walking neighborhood fine s restrictions  No obvious day to day or daytime variabilty or assoc chronic cough or cp or chest tightness, subjective wheeze overt sinus or hb symptoms. No unusual exp hx or h/o childhood pna/ asthma or knowledge of premature birth.  Sleeping ok without nocturnal  or early am exacerbation  of respiratory  c/o's or need for noct saba. Also denies any obvious fluctuation of symptoms with weather or environmental changes or other aggravating or alleviating factors except as outlined above   Current Medications, Allergies, Complete Past Medical History, Past Surgical History, Family History, and Social History were reviewed in [employer] record.  ROS  The following are not active complaints unless bolded sore  throat, dysphagia, dental problems, itching, sneezing,  nasal congestion or excess/ purulent secretions, ear ache,   fever, chills, sweats, unintended wt loss, pleuritic or exertional cp, hemoptysis,  orthopnea pnd or leg swelling, presyncope, palpitations, heartburn, abdominal pain, anorexia, nausea, vomiting, diarrhea  or change in bowel or urinary habits, change in stools or urine, dysuria,hematuria,  rash, arthralgias, visual complaints, headache, numbness weakness or ataxia or problems with walking or coordination,  change in mood/affect or memory.                Objective:   Physical Exam  11/13/2012      330  Wt Readings from Last 3 Encounters:  10/16/12 325 lb (147.419 kg)  10/08/12 318 lb 9.6 oz (144.516 kg)  07/10/12 310 lb 8 oz (140.842 kg)     amb obese wm no longer with any   pseudowheeze   HEENT: nl dentition, turbinates, and orophanx. Nl external ear canals without cough reflex   NECK :  without JVD/Nodes/TM/ nl carotid upstrokes bilaterally   LUNGS: no acc muscle use, clear to A and P bilaterally without cough on insp or exp maneuvers   CV:  RRR  no s3 or murmur or increase in P2, no edema   ABD:  soft and nontender with nl excursion in the supine position. No bruits or organomegaly, bowel sounds nl  MS:  warm without deformities, calf tenderness, cyanosis or clubbing  10/08/12 No acute cardiopulmonary abnormality.           Assessment & Plan:

## 2012-11-13 NOTE — Patient Instructions (Addendum)
micardis 80 mg one half daily (insurance formulary)   We will need to schedule you for full pft's before the end of the year and I will call you with results   If you are satisfied with your treatment plan let your doctor know and he/she can either refill your medications or you can return here when your prescription runs out.     If in any way you are not 100% satisfied,  please tell us.  If 100% better, tell your friends!

## 2012-11-14 NOTE — Assessment & Plan Note (Addendum)
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   These are the same pts (now being labeled as having "irritable larynx syndrome" by some cough centers) who not infrequently have a history of having failed to tolerate ace inhibitors,  dry powder inhalers or biphosphonates or report having atypical reflux symptoms that don't respond to standard doses of PPI , and are easily confused as having aecopd or asthma flares by even experienced allergists/ pulmonologists.   rec maintain off acei / needs baseline pft's to complete the w/u but no further pulm clinic f/u

## 2012-11-14 NOTE — Assessment & Plan Note (Signed)
Changed acei to micardis 40 mg one daily starting 10/17/2012 due to ? Pseudoasthma> resolved   Adequate control on present rx, reviewed > no change in rx needed  > continue micardis or reasonable insurance formulary alternative

## 2012-11-15 ENCOUNTER — Ambulatory Visit: Payer: BC Managed Care – HMO

## 2012-11-15 ENCOUNTER — Ambulatory Visit (INDEPENDENT_AMBULATORY_CARE_PROVIDER_SITE_OTHER): Payer: BC Managed Care – HMO | Admitting: Family Medicine

## 2012-11-15 VITALS — BP 130/80 | HR 64 | Temp 98.3°F | Resp 17 | Ht 71.5 in | Wt 324.0 lb

## 2012-11-15 DIAGNOSIS — M542 Cervicalgia: Secondary | ICD-10-CM

## 2012-11-15 DIAGNOSIS — M62838 Other muscle spasm: Secondary | ICD-10-CM

## 2012-11-15 MED ORDER — HYDROCODONE-ACETAMINOPHEN 5-325 MG PO TABS: 1.0000 | ORAL_TABLET | Freq: Four times a day (QID) | ORAL | Status: AC | PRN

## 2012-11-15 MED ORDER — CYCLOBENZAPRINE HCL 5 MG PO TABS: ORAL_TABLET | ORAL | Status: AC

## 2012-11-15 NOTE — Progress Notes (Addendum)
Subjective:    Patient ID: Corey Hardin, male    DOB: 08-26-1950, 62 y.o.   MRN: [id number]  This chart was scribed for Meredith Staggers, MD by Dorothey Baseman, ED Scribe.   Authored by Silas Sacramento, MD - unable to change in note.   HPI Corey Hardin is a 62 y.o. male who presents to [facility] complaining of a constant pain to the left side of the neck onset yesterday morning when he states that he may have twisted it after leaning over to move something at work. He reports associated headache. Patient denies trying any treatments at home for his symptoms. He denies any pain radiation into the shoulders, arms, or chest. He denies any extremity numbness or weakness. He denies any regular anti-inflammatory use. Patient has a history of cervical fusion 6 years ago.  Additionally seen by me on 10/08/2012 for cough that is recurrent. Patient was referred to pulmonary. Evaluated 10/16/2012 by Dr. Sherene Sires and his symptoms were thought to be upper airway syndrome. Trial off the ace inhibitor. Continued on PPI and diet. Followed up with pulmonary on 11/13/2012 and the patient was improved off the ace inhibitor. PFTs in 2 weeks.   Patient Active Problem List   Diagnosis Date Noted   Cough 10/17/2012   Diabetes mellitus type 2 in obese 02/08/2012   Obesity 02/07/2012   CAD, RCA DES 02/06/12 02/07/2012   Dyslipidemia, LDL 136- statin added 02/07/2012   STEMI (ST elevation myocardial infarction) 02/06/2012   HTN (hypertension) 02/06/2012   Past Medical History  Diagnosis Date   Hypertension     NUCLEAR STRESS TEST, 03/08/2012 - normal   Acute MI, inferior wall     2D ECHO, 03/08/2012 - EF 65-70%, normal   Past Surgical History  Procedure Laterality Date   Neck surgery     Knee surgery     Hernia repair      Rt groin   Cardiac catheterization  02/06/2012    RCA 2.5x35mm Xience DES Xpedition stent, resulting in a reduction of a 100% occlusion to 0%   No Known Allergies Prior to  Admission medications   Medication Sig Start Date End Date Taking? Authorizing Provider  aspirin EC 81 MG EC tablet Take 1 tablet (81 mg total) by mouth daily. 02/08/12  Yes Brittainy Simmons, PA-C  metoprolol succinate (TOPROL-XL) 50 MG 24 hr tablet Take 1 tablet (50 mg total) by mouth daily. Take with or immediately following a meal. 07/10/12  Yes Lennette Bihari, MD  nitroGLYCERIN (NITROSTAT) 0.4 MG SL tablet Place 1 tablet (0.4 mg total) under the tongue every 5 (five) minutes x 3 doses as needed for chest pain. 02/08/12  Yes Brittainy Simmons, PA-C  omeprazole (PRILOSEC) 20 MG capsule Take 20 mg by mouth daily before breakfast. 10/08/12  Yes Shade Flood, MD  simvastatin (ZOCOR) 40 MG tablet Take 1 tablet (40 mg total) by mouth daily at 6 PM. 07/10/12  Yes Lennette Bihari, MD  telmisartan (MICARDIS) 80 MG tablet Take 1 tablet (80 mg total) by mouth daily. 11/13/12  Yes Nyoka Cowden, MD  Ticagrelor (BRILINTA) 90 MG TABS tablet Take 1 tablet (90 mg total) by mouth 2 (two) times daily. 02/08/12  Yes Brittainy Simmons, PA-C  albuterol (PROVENTIL HFA;VENTOLIN HFA) 108 (90 BASE) MCG/ACT inhaler Inhale 2 puffs into the lungs every 6 (six) hours as needed for wheezing. 10/08/12   Shade Flood, MD   History   Social History   Marital  Status: Married    Spouse Name: N/A    Number of Children: N/A   Years of Education: N/A   Occupational History   Not on file.   Social History Main Topics   Smoking status: Never Smoker    Smokeless tobacco: Not on file   Alcohol Use: Yes   Drug Use: No   Sexual Activity: Not on file   Other Topics Concern   Not on file   Social History Narrative   No narrative on file   Review of Systems  Cardiovascular: Negative for chest pain.  Musculoskeletal: Positive for neck pain. Negative for arthralgias.  Neurological: Positive for headaches. Negative for weakness and numbness.      Objective:   Physical Exam  Nursing note and vitals  reviewed. Constitutional: He is oriented to person, place, and time. He appears well-developed and well-nourished. No distress.  HENT:  Head: Normocephalic and atraumatic.  Eyes: Conjunctivae are normal.  Neck: Neck supple.  No stridor. Spasm and tenderness to palpation to the left-sided cervical paraspinal muscles. Decreased extension. Decreased left rotation. Decreased left, lateral flexion.   Cardiovascular: Normal rate, regular rhythm and normal heart sounds.  Exam reveals no gallop and no friction rub.   No murmur heard. Pulmonary/Chest: Effort normal and breath sounds normal. No respiratory distress. He has no wheezes. He has no rales.  Abdominal: He exhibits no distension.  Musculoskeletal: Normal range of motion.  Neurological: He is alert and oriented to person, place, and time.  Brachioradialis and bicep reflexes are 1+ bilaterally. Unable to elicit tricep reflexes bilaterally, but they are equal.  Skin: Skin is warm and dry.  Psychiatric: He has a normal mood and affect. His behavior is normal.   BP 130/80   Pulse 64   Temp(Src) 98.3 F (36.8 C) (Oral)   Resp 17   Ht 5' 11.5" (1.816 m)   Wt 324 lb (146.965 kg)   BMI 44.56 kg/m2   SpO2 97%  [facility] reading (PRIMARY) by  Dr. Neva Seat:  C spine: fusion C5-6 intact. Loss of lordosis with underlying spondylosis. No acute findings.      Assessment & Plan:  3:38 PM- Will order an x-ray of the C spine. Will start patient on a course of Flexeril. Advised patient to take Tylenol and to alternate hot and cold to the affected area at home to manage symptoms. Discussed treatment plan with patient at bedside and patient verbalized agreement.   Corey Hardin is a 62 y.o. male Neck pain on left side - Plan: DG Cervical Spine 2 or 3 views, cyclobenzaprine (FLEXERIL) 5 MG tablet, HYDROcodone-acetaminophen (NORCO/VICODIN) 5-325 MG per tablet  Muscle spasms of neck - Plan: cyclobenzaprine (FLEXERIL) 5 MG tablet, HYDROcodone-acetaminophen (NORCO/VICODIN)  5-325 MG per tablet  L paraspinal strain vs. spasm vs. neuronal impingement/HNP, but strength intact.  Trial of flexeril, sx care/rom, and neck care manual.  Deferred NSAIDS given cardiac hx, but rtc if not improving next week.  RTC precautions.    Meds ordered this encounter  Medications   cyclobenzaprine (FLEXERIL) 5 MG tablet    Sig: 1-2 tablets by mouth up to every 8 hours as needed. Start with bedtime as needed due to sedation    Dispense:  15 tablet    Refill:  0   HYDROcodone-acetaminophen (NORCO/VICODIN) 5-325 MG per tablet    Sig: Take 1 tablet by mouth every 6 (six) hours as needed for moderate pain.    Dispense:  20 tablet    Refill:  0   Patient Instructions  Heat or ice to affected area, gentle stretches as able. Flexeril - 1-2 every 8 hours as needed. If needed - hydrocodone for pain, but be careful combining these two medicines. Return to the clinic or go to the nearest emergency room if any of your symptoms worsen or new symptoms occur. Can recheck in next week to 10 days if not resolving.   I personally performed the services described in this documentation, which was scribed in my presence. The recorded information has been reviewed and considered, and addended by me as needed.

## 2012-11-15 NOTE — Patient Instructions (Signed)
Heat or ice to affected area, gentle stretches as able. Flexeril - 1-2 every 8 hours as needed. If needed - hydrocodone for pain, but be careful combining these two medicines. Return to the clinic or go to the nearest emergency room if any of your symptoms worsen or new symptoms occur. Can recheck in next week to 10 days if not resolving.

## 2012-12-21 ENCOUNTER — Telehealth: Payer: Self-pay | Admitting: Cardiovascular Disease

## 2012-12-21 NOTE — Telephone Encounter (Signed)
Since his stent was less than 12 months ago he should NOT stop his ASA or Brillinta for tooth extraction.  Corine Shelter PA-C 12/21/2012 9:34 AM

## 2012-12-21 NOTE — Telephone Encounter (Signed)
Returned call and pt verified x 2.  Advice given per PA.  Pt verbalized understanding and agreed w/ plan.

## 2012-12-21 NOTE — Telephone Encounter (Signed)
Dr. Tresa Endo out of the office.  Message forwarded to Hinda Glatter, PA-C for further instructions.

## 2012-12-21 NOTE — Telephone Encounter (Signed)
Pt is having dental work next week,he might have to have tooth extracted. He has a stent,want to know if he need to do anything before his dental work.

## 2013-02-09 ENCOUNTER — Other Ambulatory Visit: Payer: Self-pay | Admitting: Cardiovascular Disease

## 2013-02-11 NOTE — Telephone Encounter (Signed)
Rx was sent to pharmacy electronically. 

## 2013-03-12 ENCOUNTER — Ambulatory Visit: Payer: BC Managed Care – PPO | Admitting: Cardiovascular Disease

## 2013-07-15 ENCOUNTER — Other Ambulatory Visit: Payer: Self-pay

## 2013-07-15 MED ORDER — METOPROLOL SUCCINATE ER 50 MG PO TB24: 50.0000 mg | ORAL_TABLET | Freq: Every day | ORAL | Status: AC

## 2013-07-15 NOTE — Telephone Encounter (Signed)
Rx was sent to pharmacy electronically. Patient needs appointment for future refills.

## 2013-12-12 ENCOUNTER — Encounter (HOSPITAL_COMMUNITY): Payer: Self-pay | Admitting: Cardiovascular Disease

## 2013-12-24 ENCOUNTER — Other Ambulatory Visit: Payer: Self-pay | Admitting: Internal Medicine

## 2014-11-09 IMAGING — CR DG CHEST 1V PORT
1 series · 1 of 1 positions shown · non-contrast
Comparison: None.

CLINICAL DATA: Chest pain.  Myocardial infarct.

PORTABLE CHEST - 1 VIEW

[view not recorded]
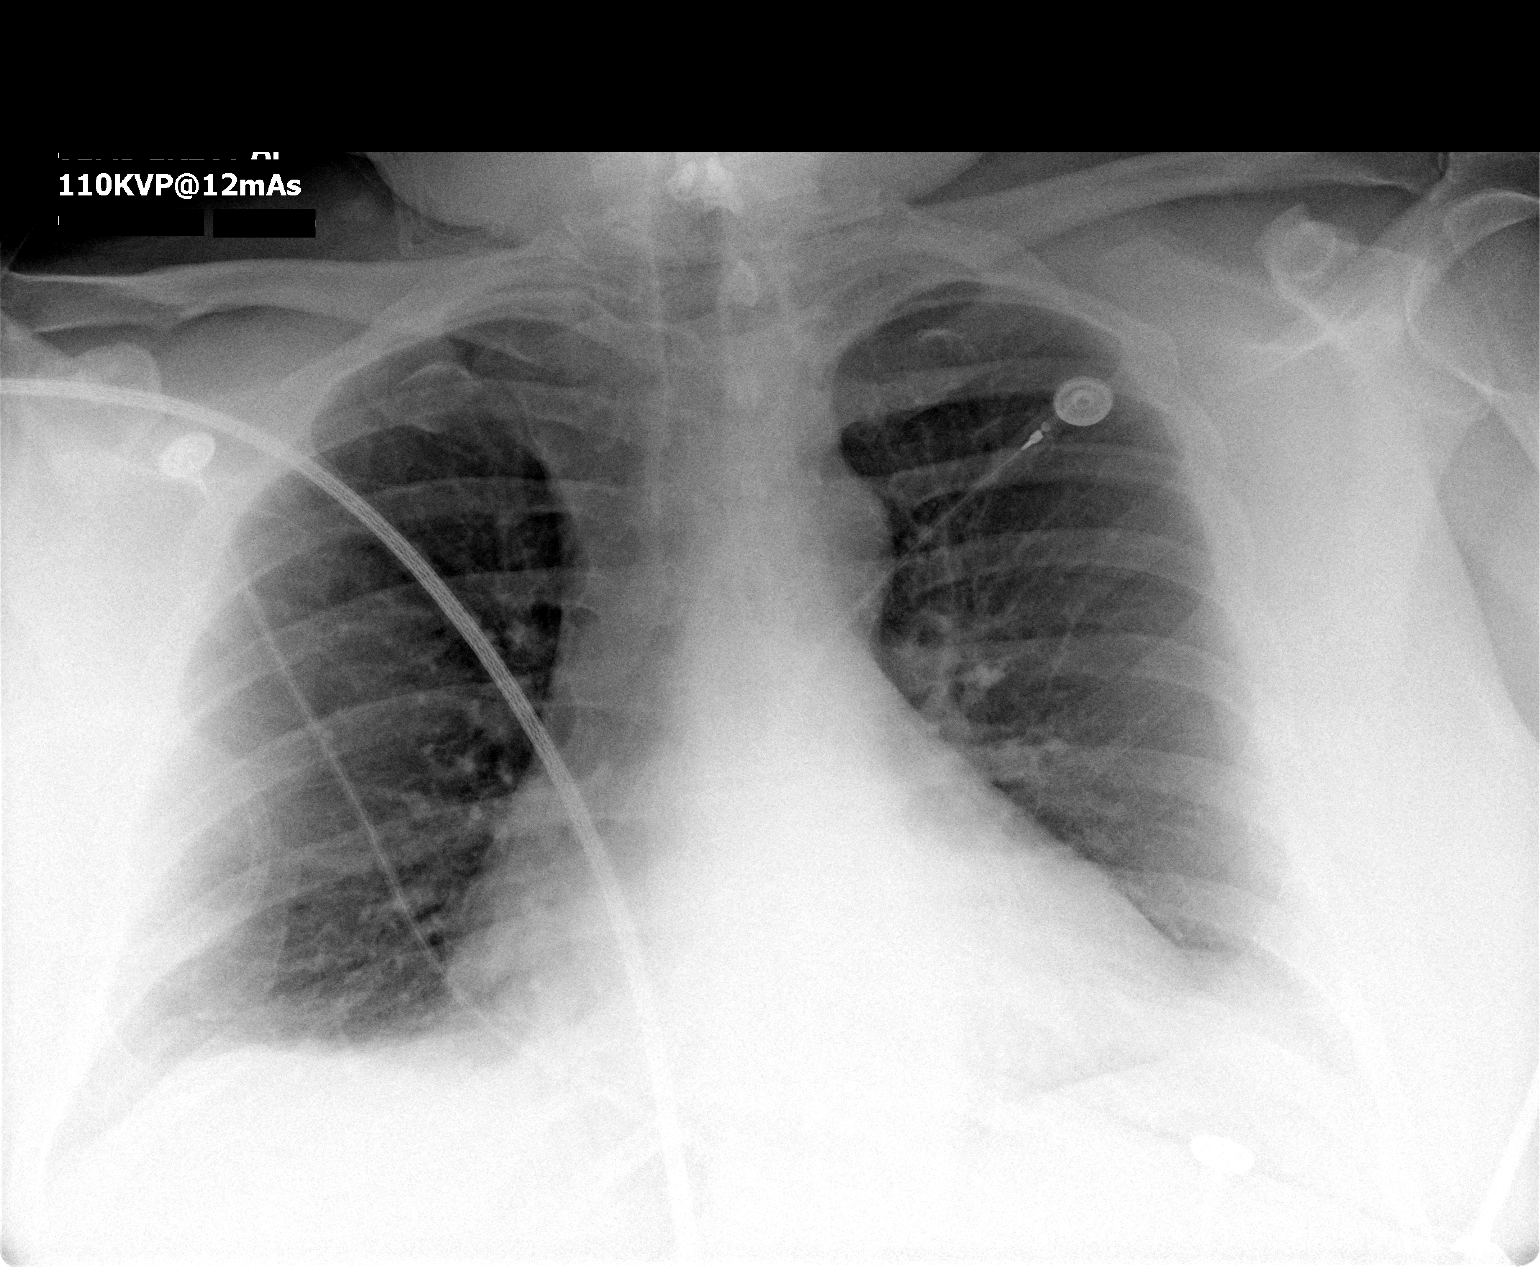

[1 of 1 positions shown; findings below may reference images not displayed]

FINDINGS: Shallow inspiration.  Borderline heart size with normal
pulmonary vascularity.  No focal consolidation or airspace disease.
No blunting of costophrenic angles.  No pneumothorax.  Mediastinal
contours appear intact.  Postoperative changes in the cervical
spine.  Degenerative changes in the left shoulder.
IMPRESSION: Shallow inspiration.  Mild cardiac enlargement.  No active
pulmonary disease.
